# Patient Record
Sex: Female | Born: 1945
Health system: Southern US, Community
[De-identification: ages and names within clinical notes are randomized; demographics above are authoritative.]

## PROBLEM LIST (undated history)

## (undated) DIAGNOSIS — I10 Essential (primary) hypertension: Secondary | ICD-10-CM

## (undated) DIAGNOSIS — I480 Paroxysmal atrial fibrillation: Secondary | ICD-10-CM

## (undated) DIAGNOSIS — R002 Palpitations: Secondary | ICD-10-CM

## (undated) HISTORY — DX: Palpitations: R00.2

## (undated) HISTORY — DX: Paroxysmal atrial fibrillation: I48.0

## (undated) HISTORY — DX: Essential (primary) hypertension: I10

---

## 1998-01-01 ENCOUNTER — Other Ambulatory Visit: Admission: RE | Admit: 1998-01-01 | Discharge: 1998-01-01 | Payer: Self-pay | Admitting: Obstetrics & Gynecology

## 1998-01-23 ENCOUNTER — Ambulatory Visit (HOSPITAL_COMMUNITY): Admission: RE | Admit: 1998-01-23 | Discharge: 1998-01-23 | Payer: Self-pay | Admitting: Obstetrics & Gynecology

## 1998-03-19 ENCOUNTER — Emergency Department (HOSPITAL_COMMUNITY): Admission: EM | Admit: 1998-03-19 | Discharge: 1998-03-19 | Payer: Self-pay | Admitting: Emergency Medicine

## 1999-03-15 ENCOUNTER — Ambulatory Visit (HOSPITAL_COMMUNITY): Admission: RE | Admit: 1999-03-15 | Discharge: 1999-03-15 | Payer: Self-pay | Admitting: Obstetrics & Gynecology

## 1999-03-15 ENCOUNTER — Encounter: Payer: Self-pay | Admitting: Obstetrics & Gynecology

## 1999-05-21 ENCOUNTER — Other Ambulatory Visit: Admission: RE | Admit: 1999-05-21 | Discharge: 1999-05-21 | Payer: Self-pay | Admitting: Obstetrics & Gynecology

## 1999-12-14 ENCOUNTER — Encounter: Payer: Self-pay | Admitting: Obstetrics & Gynecology

## 1999-12-14 ENCOUNTER — Encounter: Admission: RE | Admit: 1999-12-14 | Discharge: 1999-12-14 | Payer: Self-pay | Admitting: Obstetrics & Gynecology

## 2000-04-28 ENCOUNTER — Ambulatory Visit (HOSPITAL_COMMUNITY): Admission: RE | Admit: 2000-04-28 | Discharge: 2000-04-28 | Payer: Self-pay | Admitting: Gastroenterology

## 2000-09-26 ENCOUNTER — Other Ambulatory Visit: Admission: RE | Admit: 2000-09-26 | Discharge: 2000-09-26 | Payer: Self-pay | Admitting: Obstetrics & Gynecology

## 2001-04-20 ENCOUNTER — Encounter: Payer: Self-pay | Admitting: Obstetrics & Gynecology

## 2001-04-20 ENCOUNTER — Ambulatory Visit (HOSPITAL_COMMUNITY): Admission: RE | Admit: 2001-04-20 | Discharge: 2001-04-20 | Payer: Self-pay | Admitting: Obstetrics & Gynecology

## 2001-10-02 ENCOUNTER — Other Ambulatory Visit: Admission: RE | Admit: 2001-10-02 | Discharge: 2001-10-02 | Payer: Self-pay | Admitting: Obstetrics & Gynecology

## 2002-05-13 ENCOUNTER — Encounter: Payer: Self-pay | Admitting: Obstetrics & Gynecology

## 2002-05-13 ENCOUNTER — Ambulatory Visit (HOSPITAL_COMMUNITY): Admission: RE | Admit: 2002-05-13 | Discharge: 2002-05-13 | Payer: Self-pay | Admitting: Obstetrics & Gynecology

## 2003-05-21 ENCOUNTER — Other Ambulatory Visit: Admission: RE | Admit: 2003-05-21 | Discharge: 2003-05-21 | Payer: Self-pay | Admitting: Obstetrics & Gynecology

## 2004-06-22 ENCOUNTER — Ambulatory Visit (HOSPITAL_COMMUNITY): Admission: RE | Admit: 2004-06-22 | Discharge: 2004-06-22 | Payer: Self-pay | Admitting: Obstetrics & Gynecology

## 2004-11-16 ENCOUNTER — Other Ambulatory Visit: Admission: RE | Admit: 2004-11-16 | Discharge: 2004-11-16 | Payer: Self-pay | Admitting: Obstetrics & Gynecology

## 2005-09-27 ENCOUNTER — Ambulatory Visit (HOSPITAL_COMMUNITY): Admission: RE | Admit: 2005-09-27 | Discharge: 2005-09-27 | Payer: Self-pay | Admitting: Obstetrics & Gynecology

## 2005-11-24 ENCOUNTER — Other Ambulatory Visit: Admission: RE | Admit: 2005-11-24 | Discharge: 2005-11-24 | Payer: Self-pay | Admitting: Obstetrics & Gynecology

## 2006-11-16 ENCOUNTER — Ambulatory Visit (HOSPITAL_COMMUNITY): Admission: RE | Admit: 2006-11-16 | Discharge: 2006-11-16 | Payer: Self-pay | Admitting: Obstetrics & Gynecology

## 2008-01-16 ENCOUNTER — Ambulatory Visit (HOSPITAL_COMMUNITY): Admission: RE | Admit: 2008-01-16 | Discharge: 2008-01-16 | Payer: Self-pay | Admitting: Obstetrics & Gynecology

## 2009-02-02 ENCOUNTER — Ambulatory Visit (HOSPITAL_COMMUNITY): Admission: RE | Admit: 2009-02-02 | Discharge: 2009-02-02 | Payer: Self-pay | Admitting: Obstetrics & Gynecology

## 2010-04-06 ENCOUNTER — Ambulatory Visit (HOSPITAL_COMMUNITY): Admission: RE | Admit: 2010-04-06 | Discharge: 2010-04-06 | Payer: Self-pay | Admitting: Obstetrics & Gynecology

## 2011-01-07 NOTE — Procedures (Signed)
[facility]  Patient:    [name] [name], [name] [name]                       MRN: [id number] Proc. Date: 04/28/00 Adm. Date:  [id number] Attending:  Louie Bun CC:         Marinda Elk, M.D.   Procedure Report  PROCEDURE:  Colonoscopy.  INDICATION FOR PROCEDURE:  Family history of colon cancer in a first degree relative in a 65 year old patient with no prior colon screening.  DESCRIPTION OF PROCEDURE:  The patient was placed in the left lateral decubitus position and placed on the pulse monitor with continuous low flow oxygen delivered by nasal cannula. She was sedated with 50 mg IV Demerol and 7 mg IV Versed. The Olympus video colonoscope was inserted into the rectum and advanced to the cecum, confirmed by transillumination of McBurneys point and visualization of the ileocecal valve and appendiceal orifice. The prep was excellent. The cecum, ascending, transverse, descending and sigmoid colon all appeared normal with no masses, polyps, diverticula or other mucosal abnormalities. The rectum likewise appeared normal and retroflexed view of the anus revealed some small internal hemorrhoids. The colonoscope was then withdrawn and the patient returned to the recovery room in stable condition. The patient tolerated the procedure well and there were no immediate complications.  IMPRESSION:  Internal hemorrhoids otherwise normal colonoscopy.  PLAN:  Repeat colonoscopy in 5 years. DD:  04/28/00 TD:  04/29/00 Job: [id number]

## 2011-05-11 ENCOUNTER — Other Ambulatory Visit (HOSPITAL_COMMUNITY): Payer: Self-pay | Admitting: Obstetrics & Gynecology

## 2011-05-11 DIAGNOSIS — Z1231 Encounter for screening mammogram for malignant neoplasm of breast: Secondary | ICD-10-CM

## 2011-05-18 ENCOUNTER — Ambulatory Visit (HOSPITAL_COMMUNITY)
Admission: RE | Admit: 2011-05-18 | Discharge: 2011-05-18 | Disposition: A | Discharge status: Home / Self Care | Payer: Medicare HMO | Source: Ambulatory Visit | Attending: Obstetrics & Gynecology | Admitting: Obstetrics & Gynecology

## 2011-05-18 DIAGNOSIS — Z1231 Encounter for screening mammogram for malignant neoplasm of breast: Secondary | ICD-10-CM | POA: Insufficient documentation

## 2011-05-19 ENCOUNTER — Ambulatory Visit (HOSPITAL_COMMUNITY): Payer: Self-pay

## 2012-05-15 ENCOUNTER — Other Ambulatory Visit (HOSPITAL_COMMUNITY): Payer: Self-pay | Admitting: Obstetrics & Gynecology

## 2012-05-15 DIAGNOSIS — Z1231 Encounter for screening mammogram for malignant neoplasm of breast: Secondary | ICD-10-CM

## 2012-05-29 ENCOUNTER — Ambulatory Visit (HOSPITAL_COMMUNITY)
Admission: RE | Admit: 2012-05-29 | Discharge: 2012-05-29 | Disposition: A | Discharge status: Home / Self Care | Payer: Medicare Other | Source: Ambulatory Visit | Attending: Obstetrics & Gynecology | Admitting: Obstetrics & Gynecology

## 2012-05-29 DIAGNOSIS — Z1231 Encounter for screening mammogram for malignant neoplasm of breast: Secondary | ICD-10-CM | POA: Insufficient documentation

## 2013-06-03 ENCOUNTER — Other Ambulatory Visit (HOSPITAL_COMMUNITY): Payer: Self-pay | Admitting: Obstetrics & Gynecology

## 2013-06-03 DIAGNOSIS — Z1231 Encounter for screening mammogram for malignant neoplasm of breast: Secondary | ICD-10-CM

## 2013-06-18 ENCOUNTER — Ambulatory Visit (HOSPITAL_COMMUNITY)
Admission: RE | Admit: 2013-06-18 | Discharge: 2013-06-18 | Disposition: A | Discharge status: Home / Self Care | Payer: Medicare Other | Source: Ambulatory Visit | Attending: Obstetrics & Gynecology | Admitting: Obstetrics & Gynecology

## 2013-06-18 DIAGNOSIS — Z1231 Encounter for screening mammogram for malignant neoplasm of breast: Secondary | ICD-10-CM

## 2014-01-07 ENCOUNTER — Encounter: Payer: Self-pay | Admitting: Interventional Cardiology

## 2014-01-07 ENCOUNTER — Ambulatory Visit (INDEPENDENT_AMBULATORY_CARE_PROVIDER_SITE_OTHER): Payer: Medicare Other | Admitting: Interventional Cardiology

## 2014-01-07 VITALS — BP 124/86 | HR 66 | Ht 67.0 in | Wt 193.0 lb

## 2014-01-07 DIAGNOSIS — I4891 Unspecified atrial fibrillation: Secondary | ICD-10-CM

## 2014-01-07 DIAGNOSIS — R002 Palpitations: Secondary | ICD-10-CM | POA: Insufficient documentation

## 2014-01-07 DIAGNOSIS — I10 Essential (primary) hypertension: Secondary | ICD-10-CM | POA: Insufficient documentation

## 2014-01-07 DIAGNOSIS — I480 Paroxysmal atrial fibrillation: Secondary | ICD-10-CM | POA: Insufficient documentation

## 2014-01-07 DIAGNOSIS — Z8679 Personal history of other diseases of the circulatory system: Secondary | ICD-10-CM

## 2014-01-07 NOTE — Patient Instructions (Signed)
Your physician recommends that you continue on your current medications as directed. Please refer to the Current Medication list given to you today.  Your physician discussed the importance of regular exercise and recommended that you start or continue a regular exercise program for good health.   Your physician wants you to follow-up in: 1 year You will receive a reminder letter in the mail two months in advance. If you don't receive a letter, please call our office to schedule the follow-up appointment.  

## 2014-01-07 NOTE — Progress Notes (Signed)
 Patient ID: [name] [name], female   DOB: 07/27/1946, 68 y.o.   MRN: [id number]    [address] [place], [place]  [address] Phone: [phone] Fax:  [phone]  Date:  01/07/2014   ID:  [name] [name], DOB 11/14/1945, MRN [id number]  PCP:  No primary provider on file.   ASSESSMENT:  1. Paroxysmal atrial fibrillation 2. Hypertension 3. Structurally normal heart. He though there is a "history of mitral valve prolapse" an echocardiogram done in the past demonstrated straight a structurally normal heart  PLAN:  1. low-salt diet 2. Aerobic activity 3. She is to call if any prolonged episodes of palpitation or fatigue   SUBJECTIVE: [name] [name] is a 68 y.o. female who is asymptomatic. Denies neurological complaints.   Wt Readings from Last 3 Encounters:  01/07/14 193 lb (87.544 kg)     No past medical history on file.  Current Outpatient Prescriptions  Medication Sig Dispense Refill  . aspirin 81 MG tablet Take 81 mg by mouth daily.      Marland Kitchen. losartan-hydrochlorothiazide (HYZAAR) 50-12.5 MG per tablet Take 1 tablet by mouth daily.      . metoprolol succinate (TOPROL-XL) 100 MG 24 hr tablet Take 1 tablet by mouth daily.      . Multiple Vitamins-Minerals (EYE VITAMINS PO) Take 1 tablet by mouth daily.       No current facility-administered medications for this visit.    Allergies:   Allergies not on file  Social History:  The patient  reports that she has never smoked. She does not have any smokeless tobacco history on file. She reports that she does not drink alcohol or use illicit drugs.   ROS:  Please see the history of present illness.      All other systems reviewed and negative.   OBJECTIVE: VS:  BP 124/86  Pulse 66  Ht 5\' 7"  (1.702 m)  Wt 193 lb (87.544 kg)  BMI 30.22 kg/m2 Well nourished, well developed, in no acute distress, younger than stated age HEENT: normal Neck: JVD flat. Carotid bruit absent  Cardiac:  normal S1, S2; RRR; no murmur Lungs:   clear to auscultation bilaterally, no wheezing, rhonchi or rales Abd: soft, nontender, no hepatomegaly Ext: Edema absent. Pulses 2+ Skin: warm and dry Neuro:  CNs 2-12 intact, no focal abnormalities noted  EKG:  Normal sinus rhythm with RSR prime in V1. Unchanged from prior       Signed, Darci NeedleHenry W. B.  III, MD 01/07/2014 9:59 AM

## 2014-01-29 ENCOUNTER — Other Ambulatory Visit: Payer: Self-pay

## 2014-01-29 MED ORDER — LOSARTAN POTASSIUM-HCTZ 50-12.5 MG PO TABS: 1.0000 | ORAL_TABLET | Freq: Every day | ORAL | Status: DC

## 2014-03-04 ENCOUNTER — Other Ambulatory Visit: Payer: Self-pay | Admitting: Interventional Cardiology

## 2014-05-19 ENCOUNTER — Other Ambulatory Visit (HOSPITAL_COMMUNITY): Payer: Self-pay | Admitting: Obstetrics & Gynecology

## 2014-05-19 DIAGNOSIS — Z1231 Encounter for screening mammogram for malignant neoplasm of breast: Secondary | ICD-10-CM

## 2014-06-24 ENCOUNTER — Ambulatory Visit (HOSPITAL_COMMUNITY)
Admission: RE | Admit: 2014-06-24 | Discharge: 2014-06-24 | Disposition: A | Discharge status: Home / Self Care | Payer: Medicare Other | Source: Ambulatory Visit | Attending: Obstetrics & Gynecology | Admitting: Obstetrics & Gynecology

## 2014-06-24 DIAGNOSIS — Z1231 Encounter for screening mammogram for malignant neoplasm of breast: Secondary | ICD-10-CM | POA: Insufficient documentation

## 2014-09-23 ENCOUNTER — Other Ambulatory Visit: Payer: Self-pay | Admitting: *Deleted

## 2014-09-23 MED ORDER — LOSARTAN POTASSIUM-HCTZ 50-12.5 MG PO TABS: 1.0000 | ORAL_TABLET | Freq: Every day | ORAL | Status: DC

## 2014-12-31 ENCOUNTER — Other Ambulatory Visit: Payer: Self-pay | Admitting: Interventional Cardiology

## 2015-01-08 ENCOUNTER — Ambulatory Visit: Payer: Medicare Other | Admitting: Interventional Cardiology

## 2015-01-09 ENCOUNTER — Encounter: Payer: Self-pay | Admitting: *Deleted

## 2015-01-13 ENCOUNTER — Ambulatory Visit (INDEPENDENT_AMBULATORY_CARE_PROVIDER_SITE_OTHER): Payer: PPO | Admitting: Interventional Cardiology

## 2015-01-13 ENCOUNTER — Encounter: Payer: Self-pay | Admitting: Interventional Cardiology

## 2015-01-13 VITALS — BP 146/84 | HR 77 | Ht 65.0 in | Wt 199.0 lb

## 2015-01-13 DIAGNOSIS — I480 Paroxysmal atrial fibrillation: Secondary | ICD-10-CM

## 2015-01-13 DIAGNOSIS — I4891 Unspecified atrial fibrillation: Secondary | ICD-10-CM

## 2015-01-13 DIAGNOSIS — I10 Essential (primary) hypertension: Secondary | ICD-10-CM | POA: Diagnosis not present

## 2015-01-13 DIAGNOSIS — R002 Palpitations: Secondary | ICD-10-CM | POA: Diagnosis not present

## 2015-01-13 NOTE — Progress Notes (Signed)
Cardiology Office Note   Date:  01/13/2015   ID:  [name], [name] 1946-03-12, MRN [id number]  PCP:  Lenora Boys, MD  Cardiologist:  Lesleigh Noe, MD   Chief Complaint  Patient presents with  . PAROXYSMAL ATRIAL FIBRILLATION      History of Present Illness: [name] [name] is a 69 y.o. female who presents for palpitations, remote history of A. Fib, and hypertension.  Dede is doing well. She has no cardiopulmonary complaints. No medication side effects.    Past Medical History  Diagnosis Date  . Palpitation   . Paroxysmal atrial fibrillation   . Essential (primary) hypertension     No past surgical history on file.   Current Outpatient Prescriptions  Medication Sig Dispense Refill  . aspirin 81 MG tablet Take 81 mg by mouth daily.    Marland Kitchen losartan-hydrochlorothiazide (HYZAAR) 50-12.5 MG per tablet Take 1 tablet by mouth daily. 30 tablet 3  . metoprolol succinate (TOPROL-XL) 100 MG 24 hr tablet TAKE 1 TABLET BY MOUTH EVERY DAY 30 tablet 0  . Multiple Vitamins-Minerals (EYE VITAMINS PO) Take 1 tablet by mouth daily.     No current facility-administered medications for this visit.    Allergies:   Review of patient's allergies indicates no known allergies.    Social History:  The patient  reports that she has never smoked. She has never used smokeless tobacco. She reports that she does not drink alcohol or use illicit drugs.   Family History:  The patient's family history includes Colon cancer in her father; Diabetes in her brother, brother, and sister; Healthy in her sister and sister; Heart attack in her mother; Heart disease in her brother and mother; Leukemia in her brother.    ROS:  Please see the history of present illness.   Otherwise, review of systems are positive for none.   All other systems are reviewed and negative.    PHYSICAL EXAM: VS:  BP 146/84 mmHg  Pulse 77  Ht  (1.651 m)  Wt 199 lb (90.266 kg)  BMI 33.12 kg/m2 , BMI Body mass  index is 33.12 kg/(m^2). GEN: Well nourished, well developed, in no acute distress HEENT: normal Neck: no JVD, carotid bruits, or masses Cardiac: RRR; no murmurs, rubs, or gallops,no edema  Respiratory:  clear to auscultation bilaterally, normal work of breathing GI: soft, nontender, nondistended, + BS MS: no deformity or atrophy Skin: warm and dry, no rash Neuro:  Strength and sensation are intact Psych: euthymic mood, full affect   EKG:  EKG is ordered today. The ekg ordered today demonstrates normal sinus rhythm, left atrial abnormality, otherwise normal.   Recent Labs: No results found for requested labs within last 365 days.    Lipid Panel No results found for: CHOL, TRIG, HDL, CHOLHDL, VLDL, LDLCALC, LDLDIRECT    Wt Readings from Last 3 Encounters:  01/13/15 199 lb (90.266 kg)  01/07/14 193 lb (87.544 kg)      Other studies Reviewed: Additional studies/ records that were reviewed today include: . Review of the above records demonstrates:    ASSESSMENT AND PLAN:  Paroxysmal atrial fibrillation - CHADS VASC is 2. If ever has recurrent A. fib, we'll need to consider anticoagulation.  Essential hypertension - mildly elevated       Current medicines are reviewed at length with the patient today.  The patient does not have concerns regarding medicines.  The following changes have been made:  no change  Labs/ tests  ordered today include:  No orders of the defined types were placed in this encounter.     Disposition:   FU with HS in 1 year  Signed, Lesleigh NoeSMITH III,Enis Riecke W, MD  01/13/2015 9:51 AM    [facility] [address], [place], [place]  [address] Phone: [phone]([phone]; Fax: [phone]

## 2015-01-13 NOTE — Patient Instructions (Signed)
Medication Instructions:  Your physician recommends that you continue on your current medications as directed. Please refer to the Current Medication list given to you today.   Labwork: None   Testing/Procedures: None   Follow-Up: Your physician wants you to follow-up in: 1 year with Dr.Smith You will receive a reminder letter in the mail two months in advance. If you don't receive a letter, please call our office to schedule the follow-up appointment.   Any Other Special Instructions Will Be Listed Below (If Applicable). Your physician discussed the importance of regular exercise and recommended that you start or continue a regular exercise program for good health.  Low-Sodium Eating Plan Sodium raises blood pressure and causes water to be held in the body. Getting less sodium from food will help lower your blood pressure, reduce any swelling, and protect your heart, liver, and kidneys. We get sodium by adding salt (sodium chloride) to food. Most of our sodium comes from canned, boxed, and frozen foods. Restaurant foods, fast foods, and pizza are also very high in sodium. Even if you take medicine to lower your blood pressure or to reduce fluid in your body, getting less sodium from your food is important. WHAT IS MY PLAN? Most people should limit their sodium intake to 2,300 mg a day. Your health care provider recommends that you limit your sodium intake to __________ a day.  WHAT DO I NEED TO KNOW ABOUT THIS EATING PLAN? For the low-sodium eating plan, you will follow these general guidelines:  Choose foods with a % Daily Value for sodium of less than 5% (as listed on the food label).   Use salt-free seasonings or herbs instead of table salt or sea salt.   Check with your health care provider or pharmacist before using salt substitutes.   Eat fresh foods.  Eat more vegetables and fruits.  Limit canned vegetables. If you do use them, rinse them well to decrease the sodium.    Limit cheese to 1 oz (28 g) per day.   Eat lower-sodium products, often labeled as "lower sodium" or "no salt added."  Avoid foods that contain monosodium glutamate (MSG). MSG is sometimes added to Chinese food and some canned foods.  Check food labels (Nutrition Facts labels) on foods to learn how much sodium is in one serving.  Eat more home-cooked food and less restaurant, buffet, and fast food.  When eating at a restaurant, ask that your food be prepared with less salt or none, if possible.  HOW DO I READ FOOD LABELS FOR SODIUM INFORMATION? The Nutrition Facts label lists the amount of sodium in one serving of the food. If you eat more than one serving, you must multiply the listed amount of sodium by the number of servings. Food labels may also identify foods as:  Sodium free--Less than 5 mg in a serving.  Very low sodium--35 mg or less in a serving.  Low sodium--140 mg or less in a serving.  Light in sodium--50% less sodium in a serving. For example, if a food that usually has 300 mg of sodium is changed to become light in sodium, it will have 150 mg of sodium.  Reduced sodium--25% less sodium in a serving. For example, if a food that usually has 400 mg of sodium is changed to reduced sodium, it will have 300 mg of sodium. WHAT FOODS CAN I EAT? Grains Low-sodium cereals, including oats, puffed wheat and rice, and shredded wheat cereals. Low-sodium crackers. Unsalted rice and pasta. Lower-sodium   bread.  Vegetables Frozen or fresh vegetables. Low-sodium or reduced-sodium canned vegetables. Low-sodium or reduced-sodium tomato sauce and paste. Low-sodium or reduced-sodium tomato and vegetable juices.  Fruits Fresh, frozen, and canned fruit. Fruit juice.  Meat and Other Protein Products Low-sodium canned tuna and salmon. Fresh or frozen meat, poultry, seafood, and fish. Lamb. Unsalted nuts. Dried beans, peas, and lentils without added salt. Unsalted canned beans.  Homemade soups without salt. Eggs.  Dairy Milk. Soy milk. Ricotta cheese. Low-sodium or reduced-sodium cheeses. Yogurt.  Condiments Fresh and dried herbs and spices. Salt-free seasonings. Onion and garlic powders. Low-sodium varieties of mustard and ketchup. Lemon juice.  Fats and Oils Reduced-sodium salad dressings. Unsalted butter.  Other Unsalted popcorn and pretzels.  The items listed above may not be a complete list of recommended foods or beverages. Contact your dietitian for more options. WHAT FOODS ARE NOT RECOMMENDED? Grains Instant hot cereals. Bread stuffing, pancake, and biscuit mixes. Croutons. Seasoned rice or pasta mixes. Noodle soup cups. Boxed or frozen macaroni and cheese. Self-rising flour. Regular salted crackers. Vegetables Regular canned vegetables. Regular canned tomato sauce and paste. Regular tomato and vegetable juices. Frozen vegetables in sauces. Salted french fries. Olives. Pickles. Relishes. Sauerkraut. Salsa. Meat and Other Protein Products Salted, canned, smoked, spiced, or pickled meats, seafood, or fish. Bacon, ham, sausage, hot dogs, corned beef, chipped beef, and packaged luncheon meats. Salt pork. Jerky. Pickled herring. Anchovies, regular canned tuna, and sardines. Salted nuts. Dairy Processed cheese and cheese spreads. Cheese curds. Blue cheese and cottage cheese. Buttermilk.  Condiments Onion and garlic salt, seasoned salt, table salt, and sea salt. Canned and packaged gravies. Worcestershire sauce. Tartar sauce. Barbecue sauce. Teriyaki sauce. Soy sauce, including reduced sodium. Steak sauce. Fish sauce. Oyster sauce. Cocktail sauce. Horseradish. Regular ketchup and mustard. Meat flavorings and tenderizers. Bouillon cubes. Hot sauce. Tabasco sauce. Marinades. Taco seasonings. Relishes. Fats and Oils Regular salad dressings. Salted butter. Margarine. Ghee. Bacon fat.  Other Potato and tortilla chips. Corn chips and puffs. Salted popcorn  and pretzels. Canned or dried soups. Pizza. Frozen entrees and pot pies.  The items listed above may not be a complete list of foods and beverages to avoid. Contact your dietitian for more information. Document Released: 01/28/2002 Document Revised: 08/13/2013 Document Reviewed: 06/12/2013 Exit[employer] Patient Information 2015 [employer]. This information is not intended to replace advice given to you by your health care provider. Make sure you discuss any questions you have with your health care provider.  

## 2015-01-28 ENCOUNTER — Other Ambulatory Visit: Payer: Self-pay | Admitting: Interventional Cardiology

## 2015-01-28 ENCOUNTER — Other Ambulatory Visit: Payer: Self-pay | Admitting: *Deleted

## 2015-01-28 MED ORDER — LOSARTAN POTASSIUM-HCTZ 50-12.5 MG PO TABS: 1.0000 | ORAL_TABLET | Freq: Every day | ORAL | Status: DC

## 2015-05-21 ENCOUNTER — Other Ambulatory Visit: Payer: Self-pay | Admitting: *Deleted

## 2015-05-21 MED ORDER — LOSARTAN POTASSIUM-HCTZ 50-12.5 MG PO TABS: 1.0000 | ORAL_TABLET | Freq: Every day | ORAL | Status: DC

## 2015-05-28 ENCOUNTER — Other Ambulatory Visit: Payer: Self-pay

## 2015-05-28 DIAGNOSIS — Z1231 Encounter for screening mammogram for malignant neoplasm of breast: Secondary | ICD-10-CM

## 2015-06-30 ENCOUNTER — Ambulatory Visit
Admission: RE | Admit: 2015-06-30 | Discharge: 2015-06-30 | Disposition: A | Discharge status: Home / Self Care | Payer: PPO | Source: Ambulatory Visit

## 2015-06-30 DIAGNOSIS — Z1231 Encounter for screening mammogram for malignant neoplasm of breast: Secondary | ICD-10-CM

## 2015-12-20 ENCOUNTER — Other Ambulatory Visit: Payer: Self-pay | Admitting: Interventional Cardiology

## 2016-01-18 ENCOUNTER — Other Ambulatory Visit: Payer: Self-pay | Admitting: Interventional Cardiology

## 2016-01-21 ENCOUNTER — Other Ambulatory Visit: Payer: Self-pay | Admitting: Interventional Cardiology

## 2016-02-16 ENCOUNTER — Other Ambulatory Visit: Payer: Self-pay | Admitting: Interventional Cardiology

## 2016-03-22 ENCOUNTER — Telehealth: Payer: Self-pay

## 2016-03-22 ENCOUNTER — Other Ambulatory Visit: Payer: Self-pay | Admitting: Interventional Cardiology

## 2016-03-22 NOTE — Telephone Encounter (Signed)
See previous note

## 2016-03-22 NOTE — Telephone Encounter (Signed)
called and spoke with patient to let her know she needs to schedule follow up office visit to recieve further refills. She expressed her understanding, transfered to scheduling and explained to ask for refills after scheduling appointment.

## 2016-06-06 ENCOUNTER — Ambulatory Visit (INDEPENDENT_AMBULATORY_CARE_PROVIDER_SITE_OTHER): Payer: PPO | Admitting: Interventional Cardiology

## 2016-06-06 ENCOUNTER — Encounter (INDEPENDENT_AMBULATORY_CARE_PROVIDER_SITE_OTHER): Payer: Self-pay

## 2016-06-06 ENCOUNTER — Encounter: Payer: Self-pay | Admitting: Interventional Cardiology

## 2016-06-06 VITALS — BP 140/88 | HR 68 | Ht 65.0 in | Wt 194.0 lb

## 2016-06-06 DIAGNOSIS — I10 Essential (primary) hypertension: Secondary | ICD-10-CM | POA: Diagnosis not present

## 2016-06-06 DIAGNOSIS — I480 Paroxysmal atrial fibrillation: Secondary | ICD-10-CM

## 2016-06-06 MED ORDER — LOSARTAN POTASSIUM-HCTZ 50-12.5 MG PO TABS: 1.0000 | ORAL_TABLET | Freq: Every day | ORAL | 11 refills | Status: DC

## 2016-06-06 MED ORDER — METOPROLOL SUCCINATE ER 100 MG PO TB24: 100.0000 mg | ORAL_TABLET | Freq: Every day | ORAL | 11 refills | Status: DC

## 2016-06-06 NOTE — Patient Instructions (Signed)

## 2016-06-06 NOTE — Progress Notes (Signed)
Cardiology Office Note    Date:  06/06/2016   ID:  [name] [name], DOB 02/22/1946, MRN [id number]  PCP:  Joycelyn RuaMEYERS, STEPHEN, MD  Cardiologist: Lesleigh NoeHenry W Teyla Skidgel III, MD   Chief Complaint  Patient presents with  . Follow-up    History of Present Illness:  [name] [name] is a 70 y.o. female follow-up of palpitations with some concern for possible presence of atrial fibrillation although no recent complaints. Essential hypertension is also a known clinical problem.  She is doing well. She denies palpitations or prolonged heart racing. No medication side effects.  Past Medical History:  Diagnosis Date  . Essential (primary) hypertension   . Palpitation   . Paroxysmal atrial fibrillation (HCC)     No past surgical history on file.  Current Medications: Outpatient Medications Prior to Visit  Medication Sig Dispense Refill  . aspirin 81 MG tablet Take 81 mg by mouth daily.    . metoprolol succinate (TOPROL-XL) 100 MG 24 hr tablet TAKE 1 TABLET BY MOUTH EVERY DAY 30 tablet 1  . Multiple Vitamins-Minerals (EYE VITAMINS PO) Take 1 tablet by mouth daily.    Marland Kitchen. losartan-hydrochlorothiazide (HYZAAR) 50-12.5 MG tablet TAKE 1 TABLET BY MOUTH DAILY. PLEASE CALL AND SCHEDULE A ONE YEAR FOLLOW UP APPOINTMENT (Patient not taking: Reported on 06/06/2016) 15 tablet 0   No facility-administered medications prior to visit.      Allergies:   Review of patient's allergies indicates no known allergies.   Social History   Social History  . Marital status: Married    Spouse name: N/A  . Number of children: N/A  . Years of education: N/A   Social History Main Topics  . Smoking status: Never Smoker  . Smokeless tobacco: Never Used  . Alcohol use No  . Drug use: No  . Sexual activity: Not Asked   Other Topics Concern  . None   Social History Narrative  . None     Family History:  The patient's family history includes Colon cancer in her father; Diabetes in her brother, brother, and sister;  Healthy in her sister and sister; Heart attack in her mother; Heart disease in her brother and mother; Leukemia in her brother.   ROS:   Please see the history of present illness.    Concerned about weight gain but otherwise no complaints  All other systems reviewed and are negative.   PHYSICAL EXAM:   VS:  BP 140/88   Pulse 68   Ht 5\' 5"  (1.651 m)   Wt 194 lb (88 kg)   BMI 32.28 kg/m    GEN: Well nourished, well developed, in no acute distress  HEENT: normal  Neck: no JVD, carotid bruits, or masses Cardiac: RRR; no murmurs, rubs, or gallops,no edema  Respiratory:  clear to auscultation bilaterally, normal work of breathing GI: soft, nontender, nondistended, + BS MS: no deformity or atrophy  Skin: warm and dry, no rash Neuro:  Alert and Oriented x 3, Strength and sensation are intact Psych: euthymic mood, full affect  Wt Readings from Last 3 Encounters:  06/06/16 194 lb (88 kg)  01/13/15 199 lb (90.3 kg)  01/07/14 193 lb (87.5 kg)      Studies/Labs Reviewed:   EKG:  EKG  Normal sinus rhythm, left atrial abnormality, nonspecific ST abnormality.  Recent Labs: No results found for requested labs within last 8760 hours.   Lipid Panel No results found for: CHOL, TRIG, HDL, CHOLHDL, VLDL, LDLCALC, LDLDIRECT  Additional studies/ records  that were reviewed today include:  No new data    ASSESSMENT:    1. Essential hypertension   2. Paroxysmal atrial fibrillation (HCC)      PLAN:  In order of problems listed above:  1. Refill metoprolol and Hyzaar. Aerobic activity. 2 g sodium diet. 2. Call of tachycardia or prolonged palpitations.    Medication Adjustments/Labs and Tests Ordered: Current medicines are reviewed at length with the patient today.  Concerns regarding medicines are outlined above.  Medication changes, Labs and Tests ordered today are listed in the Patient Instructions below. There are no Patient Instructions on file for this visit.    Signed, Lesleigh Noe, MD  06/06/2016 12:33 PM    [facility] [address] [place], [place], [place]  [address] Phone: [phone]; Fax: [phone]

## 2016-06-21 ENCOUNTER — Other Ambulatory Visit: Payer: Self-pay | Admitting: Obstetrics & Gynecology

## 2016-06-21 DIAGNOSIS — Z1231 Encounter for screening mammogram for malignant neoplasm of breast: Secondary | ICD-10-CM

## 2016-07-05 ENCOUNTER — Ambulatory Visit
Admission: RE | Admit: 2016-07-05 | Discharge: 2016-07-05 | Disposition: A | Discharge status: Home / Self Care | Payer: PPO | Source: Ambulatory Visit | Attending: Obstetrics & Gynecology | Admitting: Obstetrics & Gynecology

## 2016-07-05 DIAGNOSIS — Z1231 Encounter for screening mammogram for malignant neoplasm of breast: Secondary | ICD-10-CM

## 2016-08-02 ENCOUNTER — Other Ambulatory Visit: Payer: Self-pay | Admitting: Interventional Cardiology

## 2016-08-23 ENCOUNTER — Other Ambulatory Visit: Payer: Self-pay

## 2016-08-23 DIAGNOSIS — Z01419 Encounter for gynecological examination (general) (routine) without abnormal findings: Secondary | ICD-10-CM | POA: Diagnosis not present

## 2016-08-23 DIAGNOSIS — Z6832 Body mass index (BMI) 32.0-32.9, adult: Secondary | ICD-10-CM | POA: Diagnosis not present

## 2016-08-23 MED ORDER — METOPROLOL SUCCINATE ER 100 MG PO TB24: 100.0000 mg | ORAL_TABLET | Freq: Every day | ORAL | 11 refills | Status: DC

## 2016-08-23 MED ORDER — LOSARTAN POTASSIUM-HCTZ 50-12.5 MG PO TABS: 1.0000 | ORAL_TABLET | Freq: Every day | ORAL | 3 refills | Status: DC

## 2017-06-13 ENCOUNTER — Encounter: Payer: Self-pay | Admitting: Interventional Cardiology

## 2017-06-13 ENCOUNTER — Ambulatory Visit (INDEPENDENT_AMBULATORY_CARE_PROVIDER_SITE_OTHER): Payer: Medicare HMO | Admitting: Interventional Cardiology

## 2017-06-13 VITALS — BP 156/94 | HR 85 | Ht 65.0 in | Wt 196.8 lb

## 2017-06-13 DIAGNOSIS — I480 Paroxysmal atrial fibrillation: Secondary | ICD-10-CM | POA: Diagnosis not present

## 2017-06-13 DIAGNOSIS — I10 Essential (primary) hypertension: Secondary | ICD-10-CM

## 2017-06-13 DIAGNOSIS — R002 Palpitations: Secondary | ICD-10-CM | POA: Diagnosis not present

## 2017-06-13 MED ORDER — LOSARTAN POTASSIUM-HCTZ 100-12.5 MG PO TABS: 1.0000 | ORAL_TABLET | Freq: Every day | ORAL | 3 refills | Status: DC

## 2017-06-13 NOTE — Patient Instructions (Signed)
Medication Instructions:  1) INCREASE Hyzaar to 100/12.5mg  once daily  Labwork: You will need to have labs drawn 2-3 weeks after starting the increased dose.  Please contact our office once you start this, so we can make a lab appointment.   Testing/Procedures: None  Follow-Up: Your physician wants you to follow-up in: 1 year with Dr. Katrinka BlazingSmith.  You will receive a reminder letter in the mail two months in advance. If you don't receive a letter, please call our office to schedule the follow-up appointment.   Any Other Special Instructions Will Be Listed Below (If Applicable).     If you need a refill on your cardiac medications before your next appointment, please call your pharmacy.

## 2017-06-13 NOTE — Progress Notes (Signed)
Cardiology Office Note    Date:  06/13/2017   ID:  [name] [name], DOB 02/08/1946, MRN [id number]  PCP:  [name] RuaMeyers, Stephen, MD  Cardiologist: [name] NoeHenry W Lovel Suazo III, MD   Chief Complaint  Patient presents with  . Follow-up    hypertension    History of Present Illness:  [name] [name] is a 71 y.o. female with history of hypertension and paroxysmal atrial fibrillation (remote)  Not exercising. Denies cardiac symptoms. No orthopnea, PND, ankle swelling, chest pain, or palpitations. Overall she feels well.   Past Medical History:  Diagnosis Date  . Essential (primary) hypertension   . Palpitation   . Paroxysmal atrial fibrillation (HCC)     History reviewed. No pertinent surgical history.  Current Medications: Outpatient Medications Prior to Visit  Medication Sig Dispense Refill  . aspirin 81 MG tablet Take 81 mg by mouth daily.    . metoprolol succinate (TOPROL-XL) 100 MG 24 hr tablet Take 1 tablet (100 mg total) by mouth daily. Take with or immediately following a meal. 30 tablet 11  . Multiple Vitamins-Minerals (EYE VITAMINS PO) Take 1 tablet by mouth daily.    Marland Kitchen. losartan-hydrochlorothiazide (HYZAAR) 50-12.5 MG tablet Take 1 tablet by mouth daily. 90 tablet 3   No facility-administered medications prior to visit.      Allergies:   Patient has no known allergies.   Social History   Social History  . Marital status: Married    Spouse name: N/A  . Number of children: N/A  . Years of education: N/A   Social History Main Topics  . Smoking status: Never Smoker  . Smokeless tobacco: Never Used  . Alcohol use No  . Drug use: No  . Sexual activity: Not Asked   Other Topics Concern  . None   Social History Narrative  . None     Family History:  The patient's family history includes Colon cancer in her father; Diabetes in her brother, brother, and sister; Healthy in her sister and sister; Heart attack in her mother; Heart disease in her brother and mother; Leukemia in  her brother.   ROS:   Please see the history of present illness.    Some rash. Otherwise no complaints.  All other systems reviewed and are negative.   PHYSICAL EXAM:   VS:  BP (!) 156/94   Pulse 85   Ht 5\' 5"  (1.651 m)   Wt 196 lb 12.8 oz (89.3 kg)   BMI 32.75 kg/m    GEN: Well nourished, well developed, in no acute distress  HEENT: normal  Neck: no JVD, carotid bruits, or masses Cardiac: RRR; no murmurs, rubs, or gallops,no edema  Respiratory:  clear to auscultation bilaterally, normal work of breathing GI: soft, nontender, nondistended, + BS MS: no deformity or atrophy  Skin: warm and dry, no rash Neuro:  Alert and Oriented x 3, Strength and sensation are intact Psych: euthymic mood, full affect  Wt Readings from Last 3 Encounters:  06/13/17 196 lb 12.8 oz (89.3 kg)  06/06/16 194 lb (88 kg)  01/13/15 199 lb (90.3 kg)      Studies/Labs Reviewed:   EKG:  EKG  Normal sinus rhythm with nonspecific ST abnormality, left atrial abnormality.  Recent Labs: No results found for requested labs within last 8760 hours.   Lipid Panel No results found for: CHOL, TRIG, HDL, CHOLHDL, VLDL, LDLCALC, LDLDIRECT  Additional studies/ records that were reviewed today include:  None    ASSESSMENT:  1. Essential hypertension   2. Paroxysmal atrial fibrillation (HCC)   3. Palpitation      PLAN:  In order of problems listed above:  1. Ideal blood pressure for [name] [name] is 130/85 mmHg a less. Increase losartan HCT to 100/12.5 mg. 7-10 days after the dose titration, a basic metabolic panel should be done. 2. No symptoms to suggest recurrence. Continue aspirin once per day. 3. No complaints.  Low-salt diet, exercise, artery blood pressure 130/85 mmHg or less. Total and LDL cholesterol 170 and 100 or less. Clinical follow-up in one year.   Significant time during this office visit was discussing appropriate risk factor modification metrics for her particular clinical  situation. Greater than half of the time during this visit was spent in counseling concerning secondary prevention.  Medication Adjustments/Labs and Tests Ordered: Current medicines are reviewed at length with the patient today.  Concerns regarding medicines are outlined above.  Medication changes, Labs and Tests ordered today are listed in the Patient Instructions below. Patient Instructions  Medication Instructions:  1) INCREASE Hyzaar to 100/12.5mg  once daily  Labwork: You will need to have labs drawn 2-3 weeks after starting the increased dose.  Please contact our office once you start this, so we can make a lab appointment.   Testing/Procedures: None  Follow-Up: Your physician wants you to follow-up in: 1 year with Dr. Katrinka Blazing.  You will receive a reminder letter in the mail two months in advance. If you don't receive a letter, please call our office to schedule the follow-up appointment.   Any Other Special Instructions Will Be Listed Below (If Applicable).     If you need a refill on your cardiac medications before your next appointment, please call your pharmacy.      Signed, [name] Noe, MD  06/13/2017 11:22 AM    [facility] [address] [place], [place], [place]  [address] Phone: [phone]; Fax: [phone]

## 2017-06-16 DIAGNOSIS — R21 Rash and other nonspecific skin eruption: Secondary | ICD-10-CM | POA: Diagnosis not present

## 2017-06-16 DIAGNOSIS — L239 Allergic contact dermatitis, unspecified cause: Secondary | ICD-10-CM | POA: Diagnosis not present

## 2017-06-28 DIAGNOSIS — J0191 Acute recurrent sinusitis, unspecified: Secondary | ICD-10-CM | POA: Diagnosis not present

## 2017-07-26 DIAGNOSIS — Z0000 Encounter for general adult medical examination without abnormal findings: Secondary | ICD-10-CM | POA: Diagnosis not present

## 2017-07-26 DIAGNOSIS — I480 Paroxysmal atrial fibrillation: Secondary | ICD-10-CM | POA: Diagnosis not present

## 2017-07-26 DIAGNOSIS — I10 Essential (primary) hypertension: Secondary | ICD-10-CM | POA: Diagnosis not present

## 2017-07-26 DIAGNOSIS — E669 Obesity, unspecified: Secondary | ICD-10-CM | POA: Diagnosis not present

## 2017-08-10 ENCOUNTER — Other Ambulatory Visit: Payer: Self-pay | Admitting: Obstetrics & Gynecology

## 2017-08-10 DIAGNOSIS — Z1231 Encounter for screening mammogram for malignant neoplasm of breast: Secondary | ICD-10-CM

## 2017-08-24 ENCOUNTER — Telehealth: Payer: Self-pay | Admitting: Interventional Cardiology

## 2017-08-24 MED ORDER — LOSARTAN POTASSIUM-HCTZ 50-12.5 MG PO TABS: 1.0000 | ORAL_TABLET | Freq: Every day | ORAL | 3 refills | Status: DC

## 2017-08-24 NOTE — Telephone Encounter (Signed)
Had reminder in Epic to contact pt about f/u labs after increasing Hyzaar to 100/12.5mg .  Pt was to do this at the beginning of the year.  Pt states she was recently seen at PCP office and BP was still "borderline".  Pt does not want to increase dose at this time.  Pt prefers to continue to monitor and states she will have Dr. Lenise ArenaMeyers, PCP, monitor her BP for now.  Pt will plan to increase dose if BP doesn't improve.  Advised I would send information to Dr. Katrinka BlazingSmith to make him aware.

## 2017-08-28 ENCOUNTER — Telehealth: Payer: Self-pay | Admitting: Interventional Cardiology

## 2017-08-28 NOTE — Telephone Encounter (Signed)
New message    Patient calling to confirm if her dosage needs to be change on losartan-hydrochlorothiazide (HYZAAR) 50-12.5 MG tablet and metoprolol succinate (TOPROL-XL) 100 MG 24 hr tablet. Please call    Pt c/o medication issue:  1. Name of Medication: Losartan and Metoprolol  2. How are you currently taking this medication (dosage and times per day)?as prescribed  3. Are you having a reaction (difficulty breathing--STAT)? No  4. What is your medication issue? Patient states her blood pressure is doing okay. Does not want medication increased.

## 2017-08-28 NOTE — Telephone Encounter (Signed)
So, she is still taking 50/12.5 mg daily? If so, update med list.

## 2017-08-28 NOTE — Telephone Encounter (Signed)
Pt would like to remain on Hyzaar 50/12.5 mg  and check  B/p and will call with reading in 1-2 weeks. Will forward message to Dr Katrinka BlazingSmith for review .Zack Seal/cy

## 2017-08-29 DIAGNOSIS — Z124 Encounter for screening for malignant neoplasm of cervix: Secondary | ICD-10-CM | POA: Diagnosis not present

## 2017-08-29 DIAGNOSIS — Z01419 Encounter for gynecological examination (general) (routine) without abnormal findings: Secondary | ICD-10-CM | POA: Diagnosis not present

## 2017-08-29 DIAGNOSIS — Z6832 Body mass index (BMI) 32.0-32.9, adult: Secondary | ICD-10-CM | POA: Diagnosis not present

## 2017-08-29 NOTE — Telephone Encounter (Signed)
Yes, she is continuing Hyzaar 50/12.5mg  QD.  Med list updated.

## 2017-09-01 ENCOUNTER — Other Ambulatory Visit: Payer: Self-pay | Admitting: Interventional Cardiology

## 2017-09-08 ENCOUNTER — Ambulatory Visit
Admission: RE | Admit: 2017-09-08 | Discharge: 2017-09-08 | Disposition: A | Discharge status: Home / Self Care | Payer: Medicare HMO | Source: Ambulatory Visit | Attending: Obstetrics & Gynecology | Admitting: Obstetrics & Gynecology

## 2017-09-08 DIAGNOSIS — Z1231 Encounter for screening mammogram for malignant neoplasm of breast: Secondary | ICD-10-CM

## 2018-06-20 ENCOUNTER — Encounter: Payer: Self-pay | Admitting: Interventional Cardiology

## 2018-06-20 ENCOUNTER — Ambulatory Visit: Payer: Medicare HMO | Admitting: Interventional Cardiology

## 2018-06-20 VITALS — BP 146/92 | HR 78 | Ht 65.0 in | Wt 194.0 lb

## 2018-06-20 DIAGNOSIS — E785 Hyperlipidemia, unspecified: Secondary | ICD-10-CM

## 2018-06-20 DIAGNOSIS — I480 Paroxysmal atrial fibrillation: Secondary | ICD-10-CM | POA: Diagnosis not present

## 2018-06-20 DIAGNOSIS — I10 Essential (primary) hypertension: Secondary | ICD-10-CM

## 2018-06-20 MED ORDER — LOSARTAN POTASSIUM-HCTZ 50-12.5 MG PO TABS: 1.0000 | ORAL_TABLET | Freq: Every day | ORAL | 3 refills | Status: DC

## 2018-06-20 MED ORDER — METOPROLOL SUCCINATE ER 100 MG PO TB24: 100.0000 mg | ORAL_TABLET | Freq: Every day | ORAL | 3 refills | Status: DC

## 2018-06-20 NOTE — Patient Instructions (Signed)

## 2018-06-20 NOTE — Progress Notes (Signed)
Cardiology Office Note:    Date:  06/20/2018   ID:  [name] [name], DOB September 23, 1945, MRN [id number]  PCP:  Joycelyn Rua, MD  Cardiologist:  Lesleigh Noe, MD   Referring MD: Joycelyn Rua, MD   Chief Complaint  Patient presents with  . Hypertension    History of Present Illness:    [name] [name] is a 72 y.o. female with a hx of history of hypertension, hyperlipidemia and paroxysmal atrial fibrillation (remote).  [name] [name] is under stress because her younger brother has gastric cancer.  Several other family members have been ill lately.  She has no cardiac complaints.  She has not been eating properly.  She has not been watching salt in her diet.  She is not getting aerobic activity as prescribed.  She is sleeping okay.  No chest pain, neurological complaints, or swelling.  Past Medical History:  Diagnosis Date  . Essential (primary) hypertension   . Palpitation   . Paroxysmal atrial fibrillation (HCC)     History reviewed. No pertinent surgical history.  Current Medications: Current Meds  Medication Sig  . aspirin 81 MG tablet Take 81 mg by mouth daily.  . Multiple Vitamins-Minerals (EYE VITAMINS PO) Take 1 tablet by mouth daily.  . [DISCONTINUED] losartan-hydrochlorothiazide (HYZAAR) 50-12.5 MG tablet Take 1 tablet by mouth daily.  . [DISCONTINUED] metoprolol succinate (TOPROL-XL) 100 MG 24 hr tablet TAKE 1 TABLET BY MOUTH EVERY DAY     Allergies:   Patient has no known allergies.   Social History   Socioeconomic History  . Marital status: Married    Spouse name: Not on file  . Number of children: Not on file  . Years of education: Not on file  . Highest education level: Not on file  Occupational History  . Not on file  Social Needs  . Financial resource strain: Not on file  . Food insecurity:    Worry: Not on file    Inability: Not on file  . Transportation needs:    Medical: Not on file    Non-medical: Not on file  Tobacco Use  . Smoking status:  Never Smoker  . Smokeless tobacco: Never Used  Substance and Sexual Activity  . Alcohol use: No    Alcohol/week: 0.0 standard drinks  . Drug use: No  . Sexual activity: Not on file  Lifestyle  . Physical activity:    Days per week: Not on file    Minutes per session: Not on file  . Stress: Not on file  Relationships  . Social connections:    Talks on phone: Not on file    Gets together: Not on file    Attends religious service: Not on file    Active member of club or organization: Not on file    Attends meetings of clubs or organizations: Not on file    Relationship status: Not on file  Other Topics Concern  . Not on file  Social History Narrative  . Not on file     Family History: The patient's family history includes Colon cancer in her father; Diabetes in her brother, brother, and sister; Healthy in her sister and sister; Heart attack in her mother; Heart disease in her brother and mother; Leukemia in her brother.  ROS:   Please see the history of present illness.    Anxiety related to her brother's health but otherwise no complaints.  All other systems reviewed and are negative.  EKGs/Labs/Other Studies Reviewed:  The following studies were reviewed today: No functional or imaging studies within the past 12 months.  EKG:  EKG is  ordered today.  The ekg ordered today demonstrates normal sinus rhythm with left atrial abnormality, otherwise normal.  When compared to June 13, 2017, no changes occurred.  Recent Labs: No results found for requested labs within last 8760 hours.  Recent Lipid Panel No results found for: CHOL, TRIG, HDL, CHOLHDL, VLDL, LDLCALC, LDLDIRECT  Physical Exam:    VS:  BP (!) 146/92   Pulse 78   Ht 5\' 5"  (1.651 m)   Wt 194 lb (88 kg)   BMI 32.28 kg/m     Wt Readings from Last 3 Encounters:  06/20/18 194 lb (88 kg)  06/13/17 196 lb 12.8 oz (89.3 kg)  06/06/16 194 lb (88 kg)     GEN:  Well nourished, well developed in no acute  distress HEENT: Normal NECK: No JVD. LYMPHATICS: No lymphadenopathy CARDIAC: RRR, no murmur, no gallop, no edema. VASCULAR: 2+ bilateral radial and carotid pulses.  No bruits. RESPIRATORY:  Clear to auscultation without rales, wheezing or rhonchi  ABDOMEN: Soft, non-tender, non-distended, No pulsatile mass, MUSCULOSKELETAL: No deformity  SKIN: Warm and dry NEUROLOGIC:  Alert and oriented x 3 PSYCHIATRIC:  Normal affect   ASSESSMENT:    1. Essential hypertension   2. Paroxysmal atrial fibrillation (HCC)   3. Hyperlipidemia LDL goal <100    PLAN:    In order of problems listed above:  1. Blood pressure target 130/80 mmHg.  Initially registered high at 146/92 on check-in.  Repeat supine blood pressure was 134/84 mmHg.  Low-salt diet and moderate aerobic activity greater than 150 minutes/week is recommended. 2. She denies palpitations and has had no clinical recurrence of atrial fibrillation now for greater than 6 years. 3. Lipids are elevated.  Her target LDL should be 100 or less given her risk factor profile.  This needs to be monitored closely and treated with statin therapy if it continues to rise.  We will discuss again on next visit.   Medication Adjustments/Labs and Tests Ordered: Current medicines are reviewed at length with the patient today.  Concerns regarding medicines are outlined above.  Orders Placed This Encounter  Procedures  . EKG 12-Lead   Meds ordered this encounter  Medications  . losartan-hydrochlorothiazide (HYZAAR) 50-12.5 MG tablet    Sig: Take 1 tablet by mouth daily.    Dispense:  90 tablet    Refill:  3  . metoprolol succinate (TOPROL-XL) 100 MG 24 hr tablet    Sig: Take 1 tablet (100 mg total) by mouth daily. Take with or immediately following a meal.    Dispense:  90 tablet    Refill:  3    PT ALMOST OUT NEEDS A NEW RX THANK YOU    Patient Instructions  Medication Instructions:  Your physician recommends that you continue on your current  medications as directed. Please refer to the Current Medication list given to you today.  If you need a refill on your cardiac medications before your next appointment, please call your pharmacy.   Lab work: none If you have labs (blood work) drawn today and your tests are completely normal, you will receive your results only by: Marland Kitchen MyChart Message (if you have MyChart) OR . A paper copy in the mail If you have any lab test that is abnormal or we need to change your treatment, we will call you to review the results.  Testing/Procedures: none  Follow-Up: At [facility], you and your health needs are our priority.  As part of our continuing mission to provide you with exceptional heart care, we have created designated Provider Care Teams.  These Care Teams include your primary Cardiologist (physician) and Advanced Practice Providers (APPs -  Physician Assistants and Nurse Practitioners) who all work together to provide you with the care you need, when you need it. You will need a follow up appointment in 12 months.  Please call our office 2 months in advance to schedule this appointment.  You may see Lesleigh Noe, MD or one of the following Advanced Practice Providers on your designated Care Team:   Norma Fredrickson, NP Nada Boozer, NP . Georgie Chard, NP  Any Other Special Instructions Will Be Listed Below (If Applicable).       Signed, Lesleigh Noe, MD  06/20/2018 6:20 PM    [facility]

## 2018-08-30 DIAGNOSIS — Z6833 Body mass index (BMI) 33.0-33.9, adult: Secondary | ICD-10-CM | POA: Diagnosis not present

## 2018-08-30 DIAGNOSIS — Z01419 Encounter for gynecological examination (general) (routine) without abnormal findings: Secondary | ICD-10-CM | POA: Diagnosis not present

## 2018-09-04 ENCOUNTER — Other Ambulatory Visit: Payer: Self-pay | Admitting: Obstetrics & Gynecology

## 2018-09-04 DIAGNOSIS — Z1231 Encounter for screening mammogram for malignant neoplasm of breast: Secondary | ICD-10-CM

## 2018-09-20 ENCOUNTER — Ambulatory Visit
Admission: RE | Admit: 2018-09-20 | Discharge: 2018-09-20 | Disposition: A | Discharge status: Home / Self Care | Payer: Medicare HMO | Source: Ambulatory Visit | Attending: Obstetrics & Gynecology | Admitting: Obstetrics & Gynecology

## 2018-09-20 DIAGNOSIS — Z1231 Encounter for screening mammogram for malignant neoplasm of breast: Secondary | ICD-10-CM

## 2018-09-21 ENCOUNTER — Other Ambulatory Visit: Payer: Self-pay | Admitting: Obstetrics & Gynecology

## 2018-09-21 DIAGNOSIS — R928 Other abnormal and inconclusive findings on diagnostic imaging of breast: Secondary | ICD-10-CM

## 2018-09-26 ENCOUNTER — Ambulatory Visit
Admission: RE | Admit: 2018-09-26 | Discharge: 2018-09-26 | Disposition: A | Discharge status: Home / Self Care | Payer: Medicare HMO | Source: Ambulatory Visit | Attending: Obstetrics & Gynecology | Admitting: Obstetrics & Gynecology

## 2018-09-26 ENCOUNTER — Other Ambulatory Visit: Payer: Self-pay | Admitting: Obstetrics & Gynecology

## 2018-09-26 DIAGNOSIS — R922 Inconclusive mammogram: Secondary | ICD-10-CM | POA: Diagnosis not present

## 2018-09-26 DIAGNOSIS — R928 Other abnormal and inconclusive findings on diagnostic imaging of breast: Secondary | ICD-10-CM

## 2018-09-26 DIAGNOSIS — N6489 Other specified disorders of breast: Secondary | ICD-10-CM

## 2018-09-26 DIAGNOSIS — N6001 Solitary cyst of right breast: Secondary | ICD-10-CM | POA: Diagnosis not present

## 2018-10-29 DIAGNOSIS — N3001 Acute cystitis with hematuria: Secondary | ICD-10-CM | POA: Diagnosis not present

## 2018-10-29 DIAGNOSIS — R350 Frequency of micturition: Secondary | ICD-10-CM | POA: Diagnosis not present

## 2019-03-28 ENCOUNTER — Other Ambulatory Visit: Payer: Self-pay

## 2019-03-28 ENCOUNTER — Ambulatory Visit: Payer: Medicare HMO

## 2019-03-28 ENCOUNTER — Ambulatory Visit
Admission: RE | Admit: 2019-03-28 | Discharge: 2019-03-28 | Disposition: A | Discharge status: Home / Self Care | Payer: Medicare HMO | Source: Ambulatory Visit | Attending: Obstetrics & Gynecology | Admitting: Obstetrics & Gynecology

## 2019-03-28 DIAGNOSIS — N6489 Other specified disorders of breast: Secondary | ICD-10-CM

## 2019-03-28 DIAGNOSIS — R928 Other abnormal and inconclusive findings on diagnostic imaging of breast: Secondary | ICD-10-CM | POA: Diagnosis not present

## 2019-04-12 IMAGING — MG DIGITAL SCREENING BILATERAL MAMMOGRAM WITH CAD
5 series · 5 of 5 positions shown · non-contrast
Comparison: Previous exam(s).

CLINICAL DATA: Screening.

EXAM:
DIGITAL SCREENING BILATERAL MAMMOGRAM WITH CAD

[L CC]
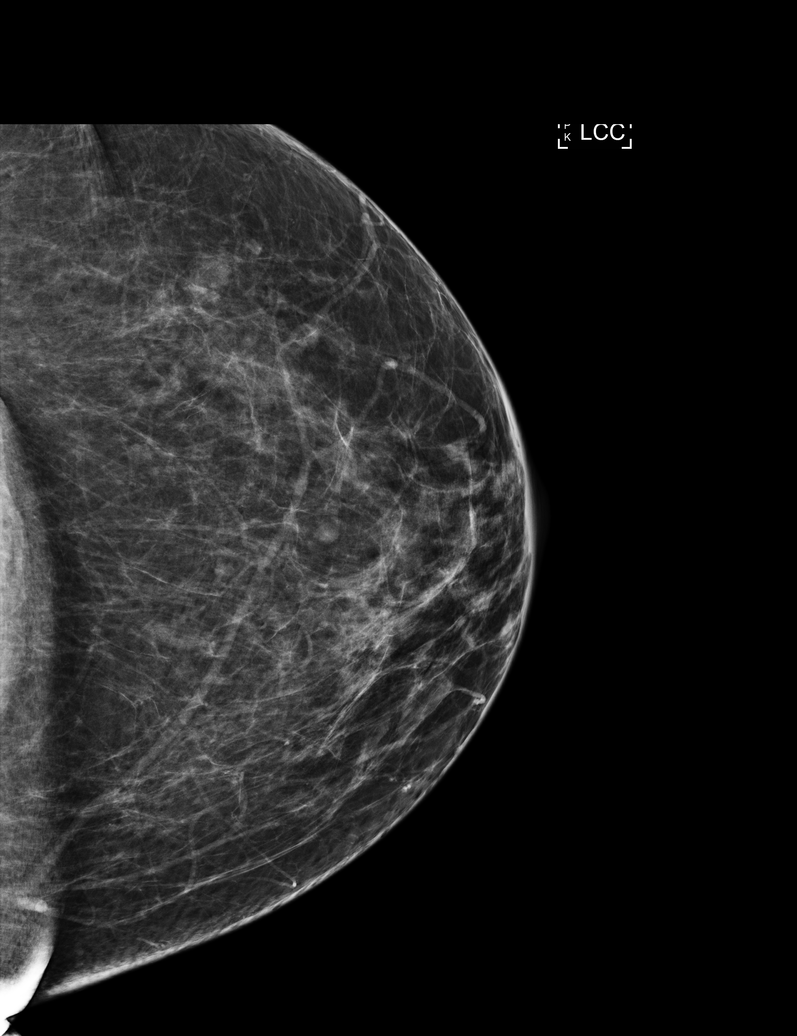

[R CC]
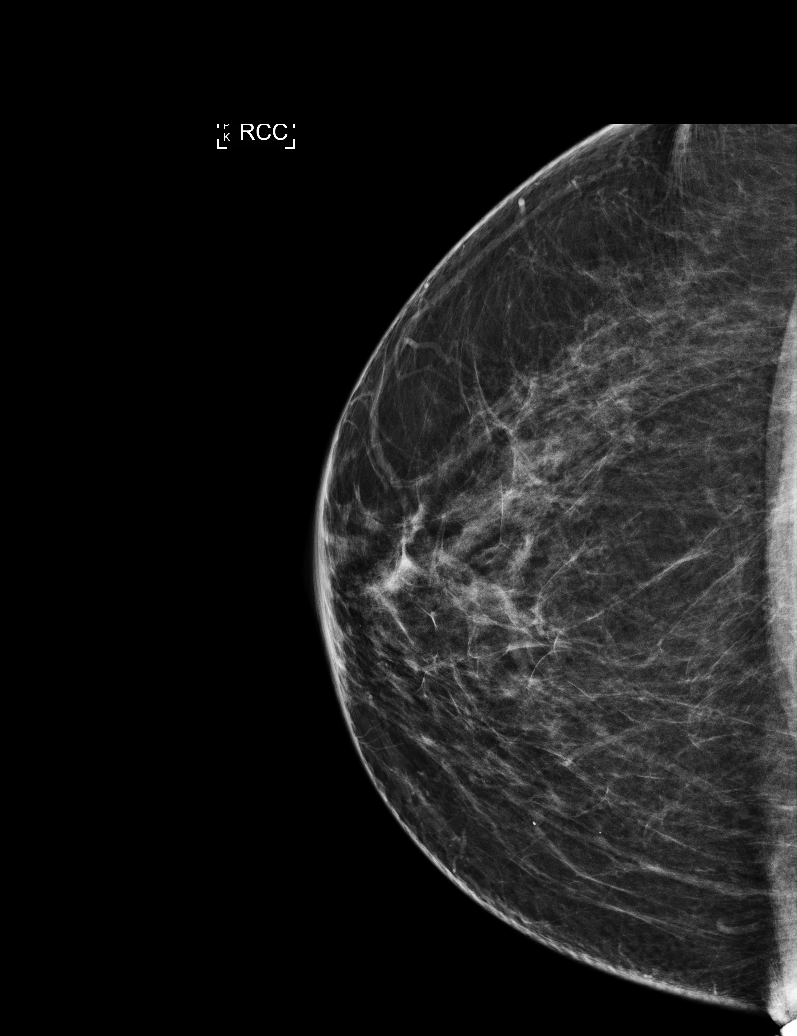

[L MLO (1 of 2)]
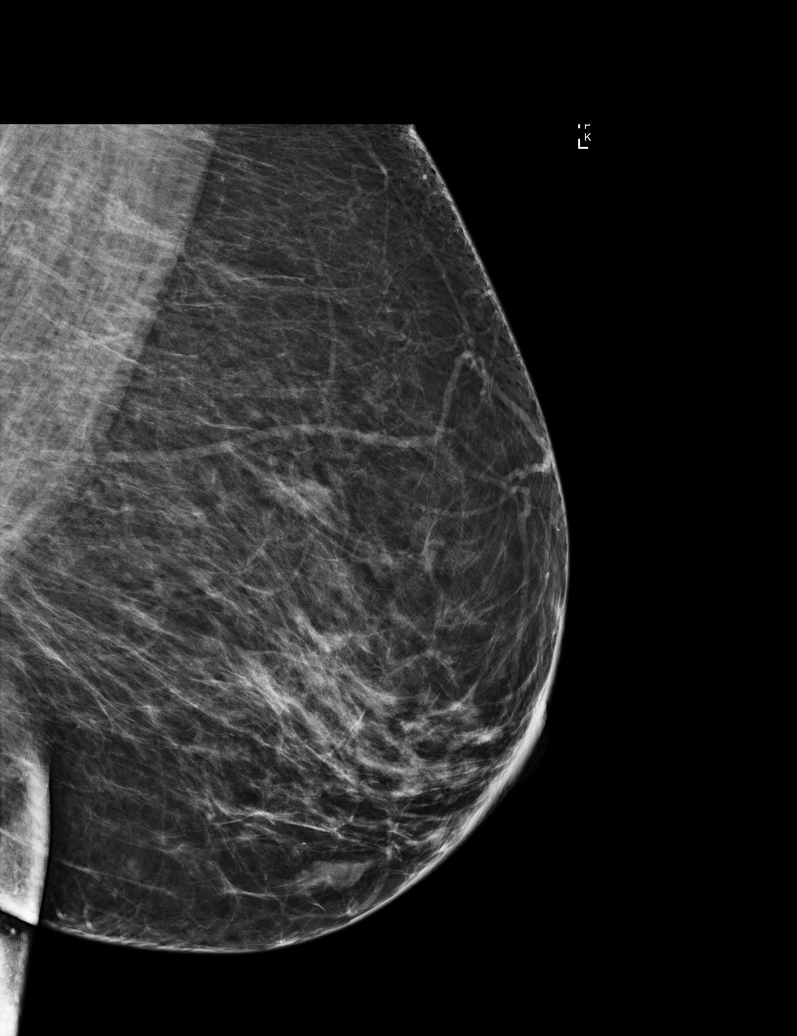

[L MLO (2 of 2)]
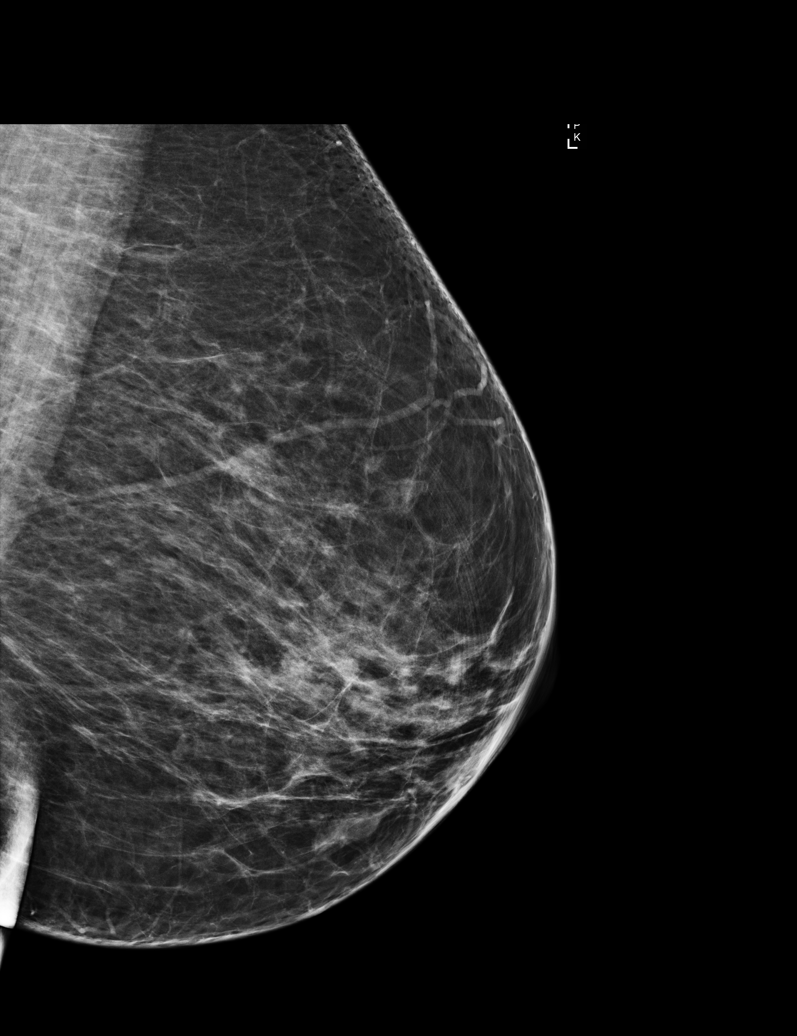

[R MLO]
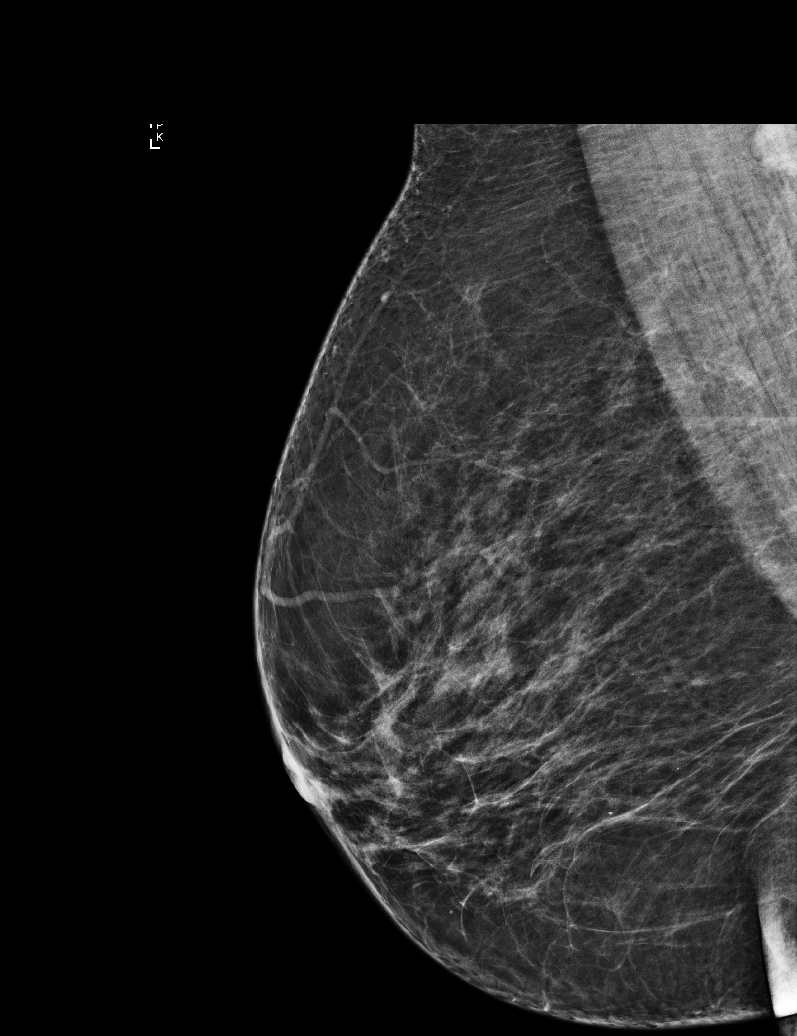

[5 of 5 positions shown; findings below may reference images not displayed]

ACR Breast Density Category b: There are scattered areas of
fibroglandular density.
FINDINGS: In the left breast, a possible asymmetry warrants further
evaluation. This possible asymmetry seen within the upper LEFT
breast, at middle depth, MLO view only.

In the right breast, no findings suspicious for malignancy. Images
were processed with CAD.
IMPRESSION: Further evaluation is suggested for possible asymmetry in the left
breast.

RECOMMENDATION:
Diagnostic mammogram and possibly ultrasound of the left breast.
(Code:QO-X-NNA)

The patient will be contacted regarding the findings, and additional
imaging will be scheduled.

BI-RADS CATEGORY  0: Incomplete. Need additional imaging evaluation
and/or prior mammograms for comparison.

## 2019-06-22 ENCOUNTER — Other Ambulatory Visit: Payer: Self-pay | Admitting: Interventional Cardiology

## 2019-06-23 ENCOUNTER — Other Ambulatory Visit: Payer: Self-pay | Admitting: Interventional Cardiology

## 2019-07-22 NOTE — Progress Notes (Signed)
Cardiology Office Note:    Date:  07/25/2019   ID:  [name], [name] 08-29-1945, MRN [id number]  PCP:  Orpah Melter, MD  Cardiologist:  Sinclair Grooms, MD   Referring MD: Orpah Melter, MD   Chief Complaint  Patient presents with  . Hypertension    History of Present Illness:    [name] [name] is a 73 y.o. female with a hx of hypertension, hyperlipidemia and paroxysmal atrial fibrillation (remote).  He has no complaints.  She denies chest pain, shortness of breath, lower extremity swelling, orthopnea, PND, palpitations, and exertional intolerance.  She has continued to exercise.  She has gained weight.  Past Medical History:  Diagnosis Date  . Essential (primary) hypertension   . Palpitation   . Paroxysmal atrial fibrillation ([place])     History reviewed. No pertinent surgical history.  Current Medications: Current Meds  Medication Sig  . aspirin 81 MG tablet Take 81 mg by mouth daily.  Marland Kitchen losartan-hydrochlorothiazide (HYZAAR) 50-12.5 MG tablet TAKE 1 TABLET BY MOUTH EVERY DAY  . metoprolol succinate (TOPROL-XL) 100 MG 24 hr tablet TAKE 1 TABLET (100 MG TOTAL) BY MOUTH DAILY. TAKE WITH OR IMMEDIATELY FOLLOWING A MEAL.  . Multiple Vitamins-Minerals (EYE VITAMINS PO) Take 1 tablet by mouth daily.     Allergies:   Patient has no known allergies.   Social History   Socioeconomic History  . Marital status: Married    Spouse name: Not on file  . Number of children: Not on file  . Years of education: Not on file  . Highest education level: Not on file  Occupational History  . Not on file  Social Needs  . Financial resource strain: Not on file  . Food insecurity    Worry: Not on file    Inability: Not on file  . Transportation needs    Medical: Not on file    Non-medical: Not on file  Tobacco Use  . Smoking status: Never Smoker  . Smokeless tobacco: Never Used  Substance and Sexual Activity  . Alcohol use: No    Alcohol/week: 0.0 standard drinks   . Drug use: No  . Sexual activity: Not on file  Lifestyle  . Physical activity    Days per week: Not on file    Minutes per session: Not on file  . Stress: Not on file  Relationships  . Social Herbalist on phone: Not on file    Gets together: Not on file    Attends religious service: Not on file    Active member of club or organization: Not on file    Attends meetings of clubs or organizations: Not on file    Relationship status: Not on file  Other Topics Concern  . Not on file  Social History Narrative  . Not on file     Family History: The patient's family history includes Colon cancer in her father; Diabetes in her brother, brother, and sister; Healthy in her sister and sister; Heart attack in her mother; Heart disease in her brother and mother; Leukemia in her brother.  ROS:   Please see the history of present illness.    Compliant with medications.  All other systems reviewed and are negative.  EKGs/Labs/Other Studies Reviewed:    The following studies were reviewed today: No new data  EKG:  EKG the electrocardiogram performed today, December 3 is normal with biatrial abnormality, and incomplete right bundle.  When compared to the prior  tracing from 1 year ago, no changes noted.  Recent Labs: No results found for requested labs within last 8760 hours.  Recent Lipid Panel No results found for: CHOL, TRIG, HDL, CHOLHDL, VLDL, LDLCALC, LDLDIRECT  Physical Exam:    VS:  BP (!) 142/78   Pulse 86   Ht 5\' 5"  (1.651 m)   Wt 200 lb 12.8 oz (91.1 kg)   SpO2 98%   BMI 33.41 kg/m     Wt Readings from Last 3 Encounters:  07/25/19 200 lb 12.8 oz (91.1 kg)  06/20/18 194 lb (88 kg)  06/13/17 196 lb 12.8 oz (89.3 kg)     GEN: Moderate obesity. No acute distress HEENT: Normal NECK: No JVD. LYMPHATICS: No lymphadenopathy CARDIAC:  RRR without murmur, gallop, or edema. VASCULAR:  Normal Pulses. No bruits. RESPIRATORY:  Clear to auscultation without rales,  wheezing or rhonchi  ABDOMEN: Soft, non-tender, non-distended, No pulsatile mass, MUSCULOSKELETAL: No deformity  SKIN: Warm and dry NEUROLOGIC:  Alert and oriented x 3 PSYCHIATRIC:  Normal affect   ASSESSMENT:    1. Paroxysmal atrial fibrillation (HCC)   2. Hyperlipidemia LDL goal <100   3. Essential hypertension   4. Educated about COVID-19 virus infection    PLAN:    In order of problems listed above:  1. No recurrence of atrial fibrillation 2. Upcoming appointment with primary physician where LDL will be checked.  I have been lobbying to treat.  She is reluctant about taking on additional medication.  Last LDL done in 2018 was 112 3. Blood pressure as usual is mildly elevated.  She feels it may be related to the circumstances.  At her medication only 1-1/2 hours ago.  I have asked her to invest in a blood pressure cuff and to measure her blood pressure 2 to 3 hours after her medications have been taken to give me a out of office perspective for blood pressure control.  She should measure it at least 2-3 times per month. 4. The 3W's is observed and being practiced.  Overall education and awareness concerning primary risk prevention was discussed in detail: LDL less than 70, hemoglobin A1c less than 7, blood pressure target less than 130/80 mmHg, >150 minutes of moderate aerobic activity per week, avoidance of smoking, weight control (via diet and exercise), and continued surveillance/management of/for obstructive sleep apnea.    Medication Adjustments/Labs and Tests Ordered: Current medicines are reviewed at length with the patient today.  Concerns regarding medicines are outlined above.  Orders Placed This Encounter  Procedures  . EKG 12-Lead   No orders of the defined types were placed in this encounter.   Patient Instructions  Medication Instructions:  Your physician recommends that you continue on your current medications as directed. Please refer to the Current Medication  list given to you today.  *If you need a refill on your cardiac medications before your next appointment, please call your pharmacy*  Lab Work: None If you have labs (blood work) drawn today and your tests are completely normal, you will receive your results only by: 2019 MyChart Message (if you have MyChart) OR . A paper copy in the mail If you have any lab test that is abnormal or we need to change your treatment, we will call you to review the results.  Testing/Procedures: None  Follow-Up: At [facility], you and your health needs are our priority.  As part of our continuing mission to provide you with exceptional heart care, we have created designated Provider  Care Teams.  These Care Teams include your primary Cardiologist (physician) and Advanced Practice Providers (APPs -  Physician Assistants and Nurse Practitioners) who all work together to provide you with the care you need, when you need it.  Your next appointment:   12 month(s)  The format for your next appointment:   In Person  Provider:   You may see Lesleigh NoeHenry W Tashonna Descoteaux III, MD or one of the following Advanced Practice Providers on your designated Care Team:    Norma FredricksonLori Gerhardt, NP  Nada BoozerLaura Ingold, NP  Georgie ChardJill McDaniel, NP   Other Instructions  Your provider recommends that you maintain 150 minutes per week of moderate aerobic activity.  Monitor your blood pressure at least 2-3 times a month.  Contact us with those readings in about a month.  Make sure      Signed, Lesleigh NoeHenry W Almarie Kurdziel III, MD  07/25/2019 9:49 AM    [facility]

## 2019-07-25 ENCOUNTER — Encounter: Payer: Self-pay | Admitting: Interventional Cardiology

## 2019-07-25 ENCOUNTER — Other Ambulatory Visit: Payer: Self-pay

## 2019-07-25 ENCOUNTER — Ambulatory Visit: Payer: Medicare HMO | Admitting: Interventional Cardiology

## 2019-07-25 VITALS — BP 142/78 | HR 86 | Ht 65.0 in | Wt 200.8 lb

## 2019-07-25 DIAGNOSIS — I480 Paroxysmal atrial fibrillation: Secondary | ICD-10-CM | POA: Diagnosis not present

## 2019-07-25 DIAGNOSIS — E785 Hyperlipidemia, unspecified: Secondary | ICD-10-CM | POA: Diagnosis not present

## 2019-07-25 DIAGNOSIS — Z7189 Other specified counseling: Secondary | ICD-10-CM

## 2019-07-25 DIAGNOSIS — I10 Essential (primary) hypertension: Secondary | ICD-10-CM

## 2019-07-25 NOTE — Patient Instructions (Addendum)
Medication Instructions:  Your physician recommends that you continue on your current medications as directed. Please refer to the Current Medication list given to you today.  *If you need a refill on your cardiac medications before your next appointment, please call your pharmacy*  Lab Work: None If you have labs (blood work) drawn today and your tests are completely normal, you will receive your results only by: Marland Kitchen MyChart Message (if you have MyChart) OR . A paper copy in the mail If you have any lab test that is abnormal or we need to change your treatment, we will call you to review the results.  Testing/Procedures: None  Follow-Up: At [facility], you and your health needs are our priority.  As part of our continuing mission to provide you with exceptional heart care, we have created designated Provider Care Teams.  These Care Teams include your primary Cardiologist (physician) and Advanced Practice Providers (APPs -  Physician Assistants and Nurse Practitioners) who all work together to provide you with the care you need, when you need it.  Your next appointment:   12 month(s)  The format for your next appointment:   In Person  Provider:   You may see Sinclair Grooms, MD or one of the following Advanced Practice Providers on your designated Care Team:    Truitt Merle, NP  Cecilie Kicks, NP  Kathyrn Drown, NP   Other Instructions  Your provider recommends that you maintain 150 minutes per week of moderate aerobic activity.  Monitor your blood pressure at least 2-3 times a month.  Contact us with those readings in about a month.  Make sure you take the reading at least 2 hours after your medications.

## 2019-09-02 DIAGNOSIS — Z6833 Body mass index (BMI) 33.0-33.9, adult: Secondary | ICD-10-CM | POA: Diagnosis not present

## 2019-09-02 DIAGNOSIS — Z01419 Encounter for gynecological examination (general) (routine) without abnormal findings: Secondary | ICD-10-CM | POA: Diagnosis not present

## 2019-09-02 DIAGNOSIS — Z124 Encounter for screening for malignant neoplasm of cervix: Secondary | ICD-10-CM | POA: Diagnosis not present

## 2019-09-17 ENCOUNTER — Other Ambulatory Visit: Payer: Self-pay | Admitting: Interventional Cardiology

## 2020-06-16 ENCOUNTER — Other Ambulatory Visit: Payer: Self-pay | Admitting: Interventional Cardiology

## 2020-07-08 DIAGNOSIS — Z0000 Encounter for general adult medical examination without abnormal findings: Secondary | ICD-10-CM | POA: Diagnosis not present

## 2020-07-08 DIAGNOSIS — I10 Essential (primary) hypertension: Secondary | ICD-10-CM | POA: Diagnosis not present

## 2020-07-08 DIAGNOSIS — I480 Paroxysmal atrial fibrillation: Secondary | ICD-10-CM | POA: Diagnosis not present

## 2020-07-08 DIAGNOSIS — Z136 Encounter for screening for cardiovascular disorders: Secondary | ICD-10-CM | POA: Diagnosis not present

## 2020-07-08 DIAGNOSIS — E669 Obesity, unspecified: Secondary | ICD-10-CM | POA: Diagnosis not present

## 2020-08-30 NOTE — Progress Notes (Signed)
Cardiology Office Note:    Date:  08/31/2020   ID:  [name] [name], [name] [name] 1946-04-01, MRN [id number]  PCP:  Joycelyn Rua, MD  Cardiologist:  Lesleigh Noe, MD   Referring MD: Joycelyn Rua, MD   Chief Complaint  Patient presents with  . Hypertension  . Hyperlipidemia    History of Present Illness:    [name] [name] is a 75 y.o. female with a hx of hypertension, hyperlipidemiaand paroxysmal atrial fibrillation (remote).  [name] [name] is doing well and has no complaints.  She is compliant with her medical regimen.  Made aware of an elevated LDL cholesterol of 135 with a triglyceride of 140.  HDL is 42.  She was offered therapy by her primary physician but refused.  She denies claudication, chest pain, transient neurological symptoms, headache, orthopnea, and PND.  She does clarify for me that her mother had stroke.  Brother had heart failure and AICD after an MI.  Past Medical History:  Diagnosis Date  . Essential (primary) hypertension   . Palpitation   . Paroxysmal atrial fibrillation (HCC)     History reviewed. No pertinent surgical history.  Current Medications: Current Meds  Medication Sig  . aspirin 81 MG tablet Take 81 mg by mouth every other day.  . losartan-hydrochlorothiazide (HYZAAR) 50-12.5 MG tablet TAKE 1 TABLET BY MOUTH EVERY DAY  . metoprolol succinate (TOPROL-XL) 100 MG 24 hr tablet Take 1 tablet (100 mg total) by mouth daily. Take with or immediately following a meal. Please keep upcoming appt in January 2022 with Dr. Katrinka Blazing for future refills. Thank you  . Multiple Vitamins-Minerals (EYE VITAMINS PO) Take 1 tablet by mouth daily.     Allergies:   Patient has no known allergies.   Social History   Socioeconomic History  . Marital status: Married    Spouse name: Not on file  . Number of children: Not on file  . Years of education: Not on file  . Highest education level: Not on file  Occupational History  . Not on file  Tobacco Use  .  Smoking status: Never Smoker  . Smokeless tobacco: Never Used  Substance and Sexual Activity  . Alcohol use: No    Alcohol/week: 0.0 standard drinks  . Drug use: No  . Sexual activity: Not on file  Other Topics Concern  . Not on file  Social History Narrative  . Not on file   Social Determinants of Health   Financial Resource Strain: Not on file  Food Insecurity: Not on file  Transportation Needs: Not on file  Physical Activity: Not on file  Stress: Not on file  Social Connections: Not on file     Family History: The patient's family history includes Colon cancer in her father; Diabetes in her brother, brother, and sister; Healthy in her sister and sister; Heart attack in her mother; Heart disease in her brother and mother; Leukemia in her brother.  ROS:   Please see the history of present illness.    No complaints.  All other systems reviewed and are negative.  EKGs/Labs/Other Studies Reviewed:    The following studies were reviewed today: No new data  EKG:  EKG normal sinus rhythm with incomplete right bundle.  Recent Labs: No results found for requested labs within last 8760 hours.  Recent Lipid Panel No results found for: CHOL, TRIG, HDL, CHOLHDL, VLDL, LDLCALC, LDLDIRECT  Physical Exam:    VS:  BP 130/76   Pulse 71   Ht 5'  5" (1.651 m)   Wt 196 lb (88.9 kg)   SpO2 96%   BMI 32.62 kg/m     Wt Readings from Last 3 Encounters:  08/31/20 196 lb (88.9 kg)  07/25/19 200 lb 12.8 oz (91.1 kg)  06/20/18 194 lb (88 kg)     GEN: Overweight. No acute distress HEENT: Normal NECK: No JVD. LYMPHATICS: No lymphadenopathy CARDIAC: No murmur. RRR no gallop, or edema. VASCULAR:  Normal Pulses. No bruits. RESPIRATORY:  Clear to auscultation without rales, wheezing or rhonchi  ABDOMEN: Soft, non-tender, non-distended, No pulsatile mass, MUSCULOSKELETAL: No deformity  SKIN: Warm and dry NEUROLOGIC:  Alert and oriented x 3 PSYCHIATRIC:  Normal affect   ASSESSMENT:     1. Essential hypertension   2. Hyperlipidemia LDL goal <100   3. Paroxysmal atrial fibrillation (HCC)   4. Educated about COVID-19 virus infection    PLAN:    In order of problems listed above:  1. Controlled.  Continue same therapy. 2. Untreated.  We will get a coronary calcium score given family history and also personal history of hypertension. 3. No clinical recurrence. 4. Vaccinated and practicing social distancing.  Boosted.   Medication Adjustments/Labs and Tests Ordered: Current medicines are reviewed at length with the patient today.  Concerns regarding medicines are outlined above.  Orders Placed This Encounter  Procedures  . CT CARDIAC SCORING (SELF PAY ONLY)  . EKG 12-Lead   No orders of the defined types were placed in this encounter.   Patient Instructions  Medication Instructions:  Your physician recommends that you continue on your current medications as directed. Please refer to the Current Medication list given to you today.  *If you need a refill on your cardiac medications before your next appointment, please call your pharmacy*   Lab Work: None If you have labs (blood work) drawn today and your tests are completely normal, you will receive your results only by: Marland Kitchen MyChart Message (if you have MyChart) OR . A paper copy in the mail If you have any lab test that is abnormal or we need to change your treatment, we will call you to review the results.   Testing/Procedures: Your physician recommends that you have a Calcium Score performed.   Follow-Up: At [facility], you and your health needs are our priority.  As part of our continuing mission to provide you with exceptional heart care, we have created designated Provider Care Teams.  These Care Teams include your primary Cardiologist (physician) and Advanced Practice Providers (APPs -  Physician Assistants and Nurse Practitioners) who all work together to provide you with the care you need, when  you need it.  We recommend signing up for the patient portal called "MyChart".  Sign up information is provided on this After Visit Summary.  MyChart is used to connect with patients for Virtual Visits (Telemedicine).  Patients are able to view lab/test results, encounter notes, upcoming appointments, etc.  Non-urgent messages can be sent to your provider as well.   To learn more about what you can do with MyChart, go to [email or web address].    Your next appointment:   1 year(s)  The format for your next appointment:   In Person  Provider:   You may see Lesleigh Noe, MD or one of the following Advanced Practice Providers on your designated Care Team:    Norma Fredrickson, NP  Nada Boozer, NP  Georgie Chard, NP    Other Instructions  Your provider recommends that you  maintain 150 minutes per week of moderate aerobic activity.      Signed, Lesleigh Noe, MD  08/31/2020 9:13 AM    [facility]

## 2020-08-31 ENCOUNTER — Ambulatory Visit: Payer: Medicare HMO | Admitting: Interventional Cardiology

## 2020-08-31 ENCOUNTER — Encounter: Payer: Self-pay | Admitting: Interventional Cardiology

## 2020-08-31 ENCOUNTER — Other Ambulatory Visit: Payer: Self-pay

## 2020-08-31 VITALS — BP 130/76 | HR 71 | Ht 65.0 in | Wt 196.0 lb

## 2020-08-31 DIAGNOSIS — Z7189 Other specified counseling: Secondary | ICD-10-CM | POA: Diagnosis not present

## 2020-08-31 DIAGNOSIS — E785 Hyperlipidemia, unspecified: Secondary | ICD-10-CM

## 2020-08-31 DIAGNOSIS — I10 Essential (primary) hypertension: Secondary | ICD-10-CM

## 2020-08-31 DIAGNOSIS — I480 Paroxysmal atrial fibrillation: Secondary | ICD-10-CM | POA: Diagnosis not present

## 2020-08-31 MED ORDER — METOPROLOL SUCCINATE ER 100 MG PO TB24: 100.0000 mg | ORAL_TABLET | Freq: Every day | ORAL | 0 refills | Status: DC

## 2020-08-31 MED ORDER — LOSARTAN POTASSIUM-HCTZ 50-12.5 MG PO TABS: 1.0000 | ORAL_TABLET | Freq: Every day | ORAL | 3 refills | Status: DC

## 2020-08-31 NOTE — Patient Instructions (Signed)
Medication Instructions:  Your physician recommends that you continue on your current medications as directed. Please refer to the Current Medication list given to you today.  *If you need a refill on your cardiac medications before your next appointment, please call your pharmacy*   Lab Work: None If you have labs (blood work) drawn today and your tests are completely normal, you will receive your results only by: Marland Kitchen MyChart Message (if you have MyChart) OR . A paper copy in the mail If you have any lab test that is abnormal or we need to change your treatment, we will call you to review the results.   Testing/Procedures: Your physician recommends that you have a Calcium Score performed.   Follow-Up: At [facility], you and your health needs are our priority.  As part of our continuing mission to provide you with exceptional heart care, we have created designated Provider Care Teams.  These Care Teams include your primary Cardiologist (physician) and Advanced Practice Providers (APPs -  Physician Assistants and Nurse Practitioners) who all work together to provide you with the care you need, when you need it.  We recommend signing up for the patient portal called "MyChart".  Sign up information is provided on this After Visit Summary.  MyChart is used to connect with patients for Virtual Visits (Telemedicine).  Patients are able to view lab/test results, encounter notes, upcoming appointments, etc.  Non-urgent messages can be sent to your provider as well.   To learn more about what you can do with MyChart, go to [email or web address].    Your next appointment:   1 year(s)  The format for your next appointment:   In Person  Provider:   You may see Lesleigh Noe, MD or one of the following Advanced Practice Providers on your designated Care Team:    Norma Fredrickson, NP  Nada Boozer, NP  Georgie Chard, NP    Other Instructions  Your provider recommends that you  maintain 150 minutes per week of moderate aerobic activity.

## 2020-09-03 DIAGNOSIS — Z6833 Body mass index (BMI) 33.0-33.9, adult: Secondary | ICD-10-CM | POA: Diagnosis not present

## 2020-09-03 DIAGNOSIS — Z01419 Encounter for gynecological examination (general) (routine) without abnormal findings: Secondary | ICD-10-CM | POA: Diagnosis not present

## 2020-09-04 ENCOUNTER — Other Ambulatory Visit: Payer: Self-pay

## 2020-09-04 ENCOUNTER — Ambulatory Visit (INDEPENDENT_AMBULATORY_CARE_PROVIDER_SITE_OTHER)
Admission: RE | Admit: 2020-09-04 | Discharge: 2020-09-04 | Disposition: A | Discharge status: Home / Self Care | Payer: Self-pay | Source: Ambulatory Visit | Attending: Interventional Cardiology | Admitting: Interventional Cardiology

## 2020-09-04 DIAGNOSIS — I10 Essential (primary) hypertension: Secondary | ICD-10-CM

## 2020-09-04 DIAGNOSIS — E785 Hyperlipidemia, unspecified: Secondary | ICD-10-CM

## 2020-12-07 ENCOUNTER — Other Ambulatory Visit: Payer: Self-pay | Admitting: Interventional Cardiology

## 2021-03-27 IMAGING — CT CT CARDIAC CORONARY ARTERY CALCIUM SCORE
3 series · 14 of 20 positions shown, 15 images · non-contrast
Comparison: None.
COMPARISON: None.

Addendum:
EXAM:
OVER-READ INTERPRETATION  CT CHEST

The following report is an over-read performed by radiologist Dr.
Fautino Gulino [REDACTED] on 09/04/2020. This over-read
does not include interpretation of cardiac or coronary anatomy or
pathology. The calcium score interpretation by the cardiologist is
attached.
CLINICAL DATA: Risk stratification
Coronary Calcium Score
TECHNIQUE: The patient was scanned on a Siemens Somatom 64 slice scanner. Axial
non-contrast 3 mm slices were carried out through the heart. The
data set was analyzed on a dedicated work station and scored using
the Agatson method.

[Series 2: casc 3.0 bv41 2 bestsyst 34 % · axial · 0.37mm/px · z∈[-204,-132]mm · 4 of 41 slices shown, 5 images]
[im 9/41  vessel]
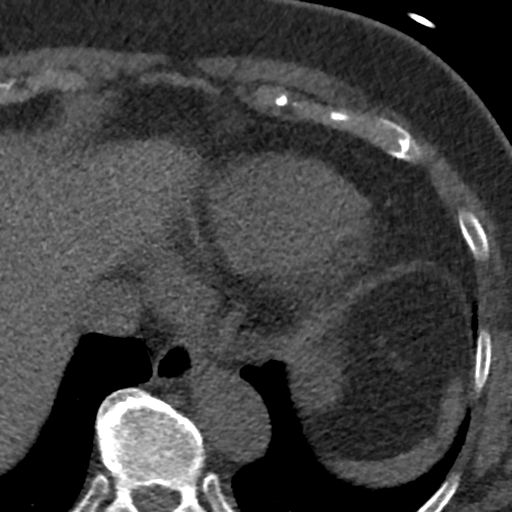
[im 9/41  lung]
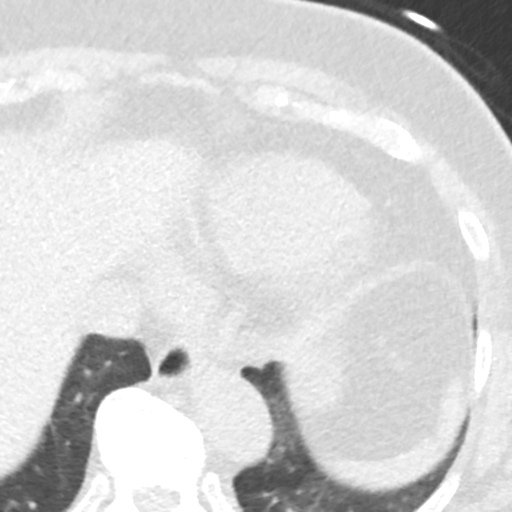
[im 17/41  vessel]
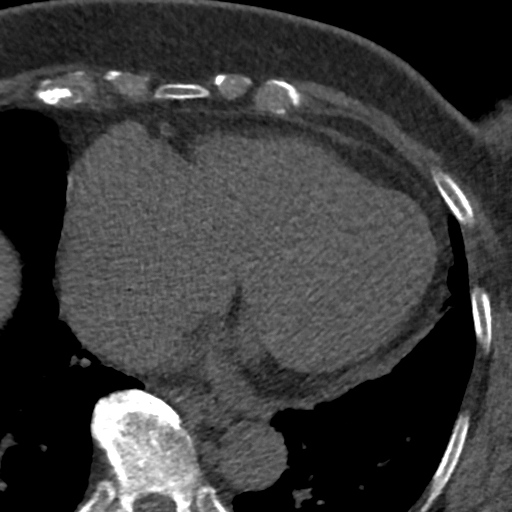
[im 25/41  vessel]
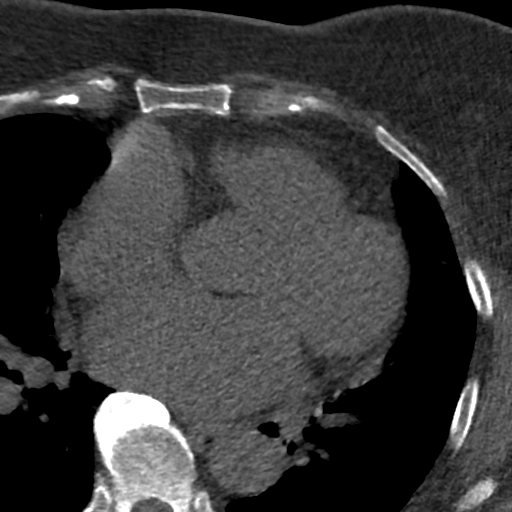
[im 33/41  vessel]
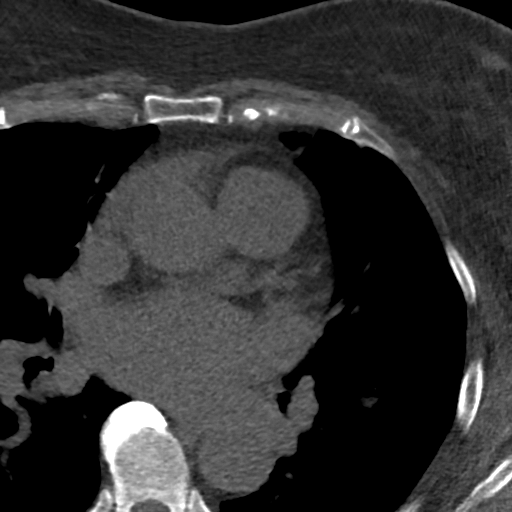

[Series 3: lung 35 % · axial · 0.65mm/px · z∈[-210,-128]mm · 5 of 41 slices shown]
[im 7/41  lung]
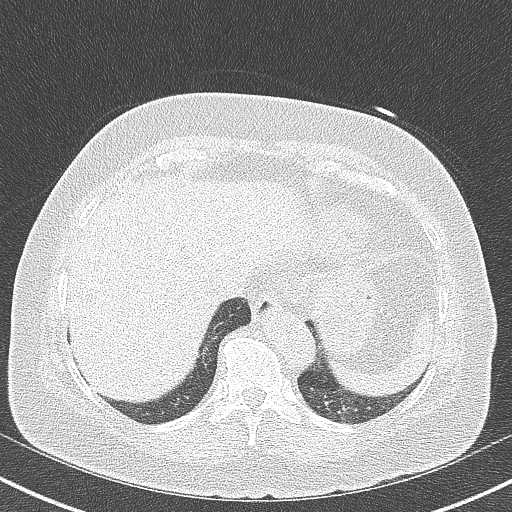
[im 14/41  lung]
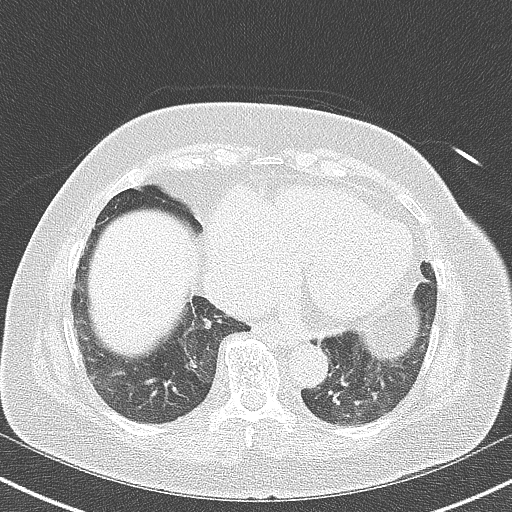
[im 21/41  lung]
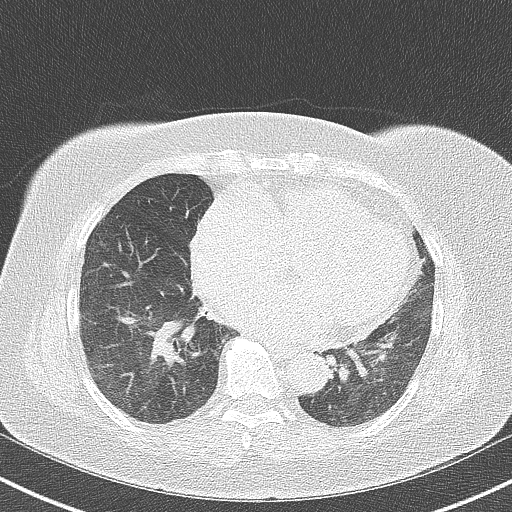
[im 27/41  lung]
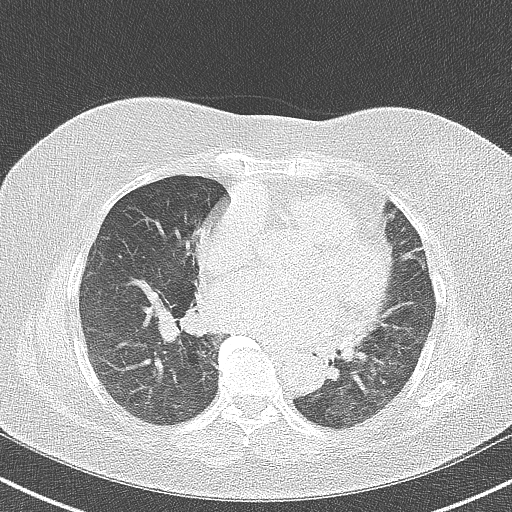
[im 34/41  lung]
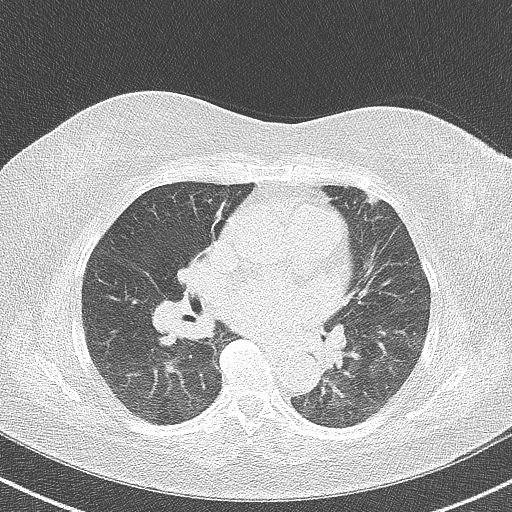

[Series 4: lung st 35 % · axial · 0.65mm/px · z∈[-210,-128]mm · 5 of 41 slices shown]
[im 7/41  lung]
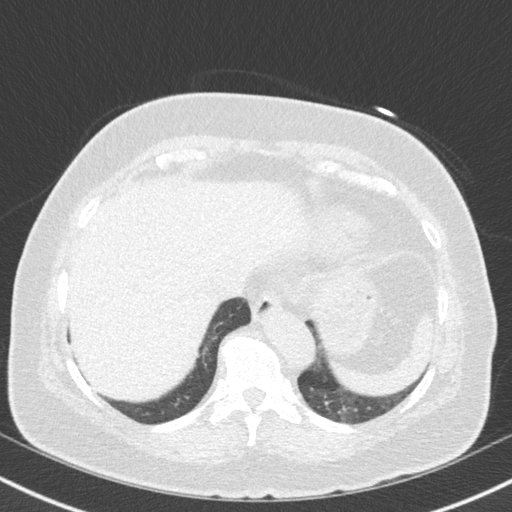
[im 14/41  lung]
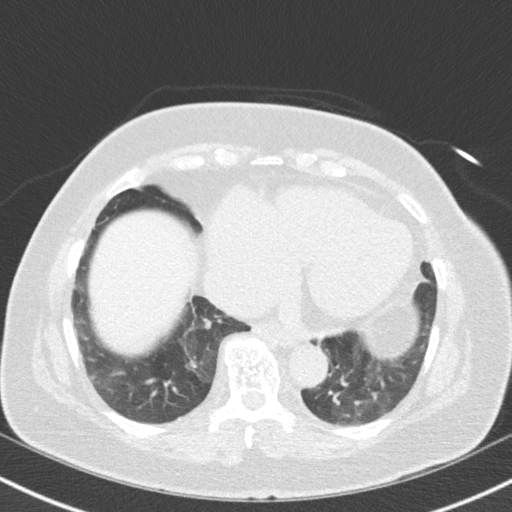
[im 21/41  lung]
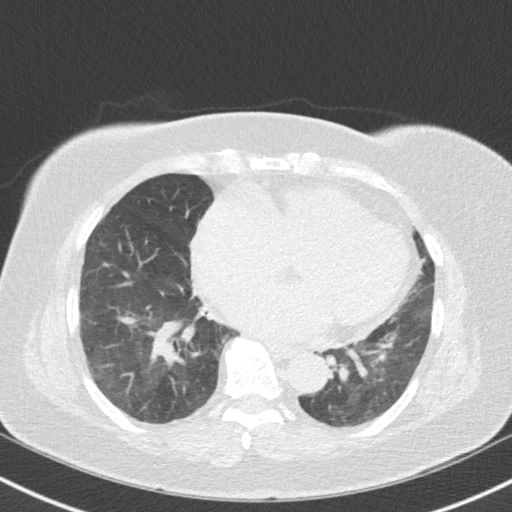
[im 27/41  lung]
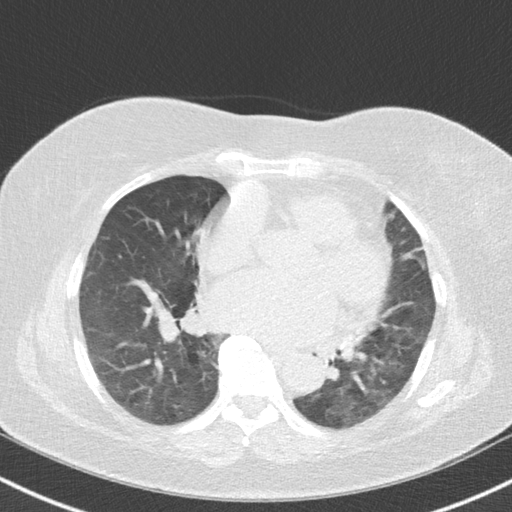
[im 34/41  lung]
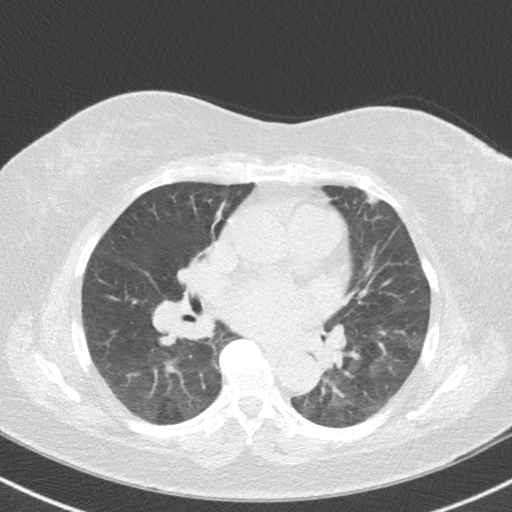

[14 of 20 positions shown; findings below may reference images not displayed]

FINDINGS: Vascular: Normal aortic caliber. Pulmonary artery enlargement,
outflow tract 3.1 cm.

Mediastinum/Nodes: No imaged thoracic adenopathy.

Lungs/Pleura: No pleural fluid. Subpleural right lower lobe
pulmonary nodules, including at 3 mm on [DATE] and [DATE].

Upper Abdomen: Subcentimeter high right hepatic lobe low-density
lesion is likely a cyst. Normal imaged portions of the spleen,
stomach.

Musculoskeletal: No acute osseous abnormality.
IMPRESSION: 1.  No acute findings in the imaged extracardiac chest.
2. Pulmonary artery enlargement suggests pulmonary arterial
hypertension.
3. Subpleural right lower lobe tiny pulmonary nodules are most
likely subpleural lymph nodes. No follow-up needed if patient is
low-risk. Non-contrast chest CT can be considered in 12 months if
patient is high-risk. This recommendation follows the consensus
statement: Guidelines for Management of Incidental Pulmonary Nodules
Detected on CT Images: From the [HOSPITAL] 8906; Radiology
FINDINGS: Non-cardiac: See separate report from [REDACTED].

Ascending aorta: Normal size; mild atherosclerosis descending aorta

Pulmonary artery: Mild enlarged (32 mm)

Pericardium: Normal

Coronary arteries: Normal origin
IMPRESSION: Coronary calcium score of 0. This was 0 percentile for age and sex
matched control.

Mild aortic atherosclerosis.

Mildly dilated pulmonary artery suggestive of pulmonary
hypertension.

Miyah Howze

*** End of Addendum ***
EXAM:
OVER-READ INTERPRETATION  CT CHEST

The following report is an over-read performed by radiologist Dr.
Fautino Gulino [REDACTED] on 09/04/2020. This over-read
does not include interpretation of cardiac or coronary anatomy or
pathology. The calcium score interpretation by the cardiologist is
attached.
FINDINGS: Vascular: Normal aortic caliber. Pulmonary artery enlargement,
outflow tract 3.1 cm.

Mediastinum/Nodes: No imaged thoracic adenopathy.

Lungs/Pleura: No pleural fluid. Subpleural right lower lobe
pulmonary nodules, including at 3 mm on [DATE] and [DATE].

Upper Abdomen: Subcentimeter high right hepatic lobe low-density
lesion is likely a cyst. Normal imaged portions of the spleen,
stomach.

Musculoskeletal: No acute osseous abnormality.
IMPRESSION: 1.  No acute findings in the imaged extracardiac chest.
2. Pulmonary artery enlargement suggests pulmonary arterial
hypertension.
3. Subpleural right lower lobe tiny pulmonary nodules are most
likely subpleural lymph nodes. No follow-up needed if patient is
low-risk. Non-contrast chest CT can be considered in 12 months if
patient is high-risk. This recommendation follows the consensus
statement: Guidelines for Management of Incidental Pulmonary Nodules
Detected on CT Images: From the [HOSPITAL] 8906; Radiology

## 2021-08-21 ENCOUNTER — Other Ambulatory Visit: Payer: Self-pay | Admitting: Interventional Cardiology

## 2021-09-06 ENCOUNTER — Other Ambulatory Visit: Payer: Self-pay | Admitting: Interventional Cardiology

## 2021-09-06 DIAGNOSIS — Z6833 Body mass index (BMI) 33.0-33.9, adult: Secondary | ICD-10-CM | POA: Diagnosis not present

## 2021-09-06 DIAGNOSIS — Z124 Encounter for screening for malignant neoplasm of cervix: Secondary | ICD-10-CM | POA: Diagnosis not present

## 2021-09-14 DIAGNOSIS — Z0000 Encounter for general adult medical examination without abnormal findings: Secondary | ICD-10-CM | POA: Diagnosis not present

## 2021-09-14 DIAGNOSIS — Z23 Encounter for immunization: Secondary | ICD-10-CM | POA: Diagnosis not present

## 2021-09-14 DIAGNOSIS — I10 Essential (primary) hypertension: Secondary | ICD-10-CM | POA: Diagnosis not present

## 2021-09-14 DIAGNOSIS — E785 Hyperlipidemia, unspecified: Secondary | ICD-10-CM | POA: Diagnosis not present

## 2021-09-27 DIAGNOSIS — J069 Acute upper respiratory infection, unspecified: Secondary | ICD-10-CM | POA: Diagnosis not present

## 2021-09-27 DIAGNOSIS — U071 COVID-19: Secondary | ICD-10-CM | POA: Diagnosis not present

## 2021-10-01 ENCOUNTER — Other Ambulatory Visit: Payer: Self-pay | Admitting: Interventional Cardiology

## 2021-10-15 ENCOUNTER — Other Ambulatory Visit: Payer: Self-pay | Admitting: Interventional Cardiology

## 2021-10-17 NOTE — Progress Notes (Signed)
Cardiology Office Note:    Date:  10/18/2021   ID:  Shyree, Pieczynski [birth year], MRN [id number]  PCP:  Orpah Melter, MD  Cardiologist:  Sinclair Grooms, MD   Referring MD: Orpah Melter, MD   Chief Complaint  Patient presents with   Coronary Artery Disease   Hyperlipidemia   Hypertension    History of Present Illness:    [name] [name] is a 76 y.o. female with a hx of atrial premature contractions, primary hypertension, hyperlipidemia and paroxysmal atrial fibrillation (remote).   She denies cardiac complaints.  She is staying active.  We decided against therapy for lipids given a coronary calcium score of 0.  She does have aortic atherosclerosis on non coronary CT.  She denies claudication, does have occasional palpitations, and has not noticed neurological complaints.  Past Medical History:  Diagnosis Date   Essential (primary) hypertension    Palpitation    Paroxysmal atrial fibrillation (HCC)     History reviewed. No pertinent surgical history.  Current Medications: Current Meds  Medication Sig   aspirin 81 MG tablet Take 81 mg by mouth every other day.   Multiple Vitamins-Minerals (EYE VITAMINS PO) Take 1 tablet by mouth daily.   vitamin C (ASCORBIC ACID) 500 MG tablet Take 500 mg by mouth daily.   [DISCONTINUED] losartan-hydrochlorothiazide (HYZAAR) 50-12.5 MG tablet TAKE 1 TABLET BY MOUTH EVERY DAY   [DISCONTINUED] metoprolol succinate (TOPROL-XL) 100 MG 24 hr tablet Take 1 tablet (100 mg total) by mouth daily. Please keep upcoming appt for future refills     Allergies:   Lisinopril   Social History   Socioeconomic History   Marital status: Married    Spouse name: Not on file   Number of children: Not on file   Years of education: Not on file   Highest education level: Not on file  Occupational History   Not on file  Tobacco Use   Smoking status: Never   Smokeless tobacco: Never  Substance and Sexual Activity   Alcohol use: No     Alcohol/week: 0.0 standard drinks   Drug use: No   Sexual activity: Not on file  Other Topics Concern   Not on file  Social History Narrative   Not on file   Social Determinants of Health   Financial Resource Strain: Not on file  Food Insecurity: Not on file  Transportation Needs: Not on file  Physical Activity: Not on file  Stress: Not on file  Social Connections: Not on file     Family History: The patient's family history includes Colon cancer in her father; Diabetes in her brother, brother, and sister; Healthy in her sister and sister; Heart attack in her mother; Heart disease in her brother and mother; Leukemia in her brother.  ROS:   Please see the history of present illness.    Wants to treat with diet and lifestyle changes and is against being overly titrated on medications.  All other systems reviewed and are negative.  EKGs/Labs/Other Studies Reviewed:    The following studies were reviewed today:  COR CA SCORE 08/2020: IMPRESSION: Coronary calcium score of 0. This was 0 percentile for age and sex matched control.   Mild aortic atherosclerosis.   Mildly dilated pulmonary artery suggestive of pulmonary hypertension.  EKG:  EKG normal sinus rhythm with premature atrial contractions complete right bundle branch block..  EKG otherwise normal with exception of left atrial abnormality.  When compared to the prior tracing from January 2022,  the PACs are new.  Recent Labs: No results found for requested labs within last 8760 hours.  Recent Lipid Panel No results found for: CHOL, TRIG, HDL, CHOLHDL, VLDL, LDLCALC, LDLDIRECT  Physical Exam:    VS:  BP 138/88    Pulse 83    Ht 5\' 5"  (1.651 m)    Wt 197 lb 3.2 oz (89.4 kg)    SpO2 96%    BMI 32.82 kg/m     Wt Readings from Last 3 Encounters:  10/18/21 197 lb 3.2 oz (89.4 kg)  08/31/20 196 lb (88.9 kg)  07/25/19 200 lb 12.8 oz (91.1 kg)     GEN: Overweight but improved compared to 2 years ago. No acute  distress HEENT: Normal NECK: No JVD. LYMPHATICS: No lymphadenopathy CARDIAC: Soft 1/6 systolic right upper sternal murmur. RRR no gallop, or edema. VASCULAR:  Normal Pulses. No bruits. RESPIRATORY:  Clear to auscultation without rales, wheezing or rhonchi  ABDOMEN: Soft, non-tender, non-distended, No pulsatile mass, MUSCULOSKELETAL: No deformity  SKIN: Warm and dry NEUROLOGIC:  Alert and oriented x 3 PSYCHIATRIC:  Normal affect   ASSESSMENT:    1. Essential hypertension   2. Hyperlipidemia LDL goal <100   3. Paroxysmal atrial fibrillation (HCC)    PLAN:    In order of problems listed above:  Blood pressure control is adequate.  She is less than 140/90.  Our target is 130/80.  Again reiterated the importance of exercise, low-salt diet, and weight loss. Aortic atherosclerosis but coronary calcium score of 0.  We had conversation but decided to continue with lifestyle changes including diet and exercise rather than adding statin therapy. No clinical episodes of atrial fibrillation.  Today's EKG demonstrates PACs.  She has no palpitations or other symptoms to suggest AF.   Overall education and awareness concerning primary risk prevention was discussed in detail: LDL less than 70, hemoglobin A1c less than 7, blood pressure target less than 130/80 mmHg, >150 minutes of moderate aerobic activity per week, avoidance of smoking, weight control (via diet and exercise), and continued surveillance/management of/for obstructive sleep apnea.    Medication Adjustments/Labs and Tests Ordered: Current medicines are reviewed at length with the patient today.  Concerns regarding medicines are outlined above.  Orders Placed This Encounter  Procedures   EKG 12-Lead   Meds ordered this encounter  Medications   losartan-hydrochlorothiazide (HYZAAR) 50-12.5 MG tablet    Sig: Take 1 tablet by mouth daily.    Dispense:  90 tablet    Refill:  3   metoprolol succinate (TOPROL-XL) 100 MG 24 hr tablet     Sig: Take 1 tablet (100 mg total) by mouth daily. Please keep upcoming appt for future refills    Dispense:  90 tablet    Refill:  3    Patient Instructions  Medication Instructions:  Your physician recommends that you continue on your current medications as directed. Please refer to the Current Medication list given to you today.  *If you need a refill on your cardiac medications before your next appointment, please call your pharmacy*   Lab Work: None If you have labs (blood work) drawn today and your tests are completely normal, you will receive your results only by: [place] (if you have MyChart) OR A paper copy in the mail If you have any lab test that is abnormal or we need to change your treatment, we will call you to review the results.   Testing/Procedures: None   Follow-Up: At [facility], you  and your health needs are our priority.  As part of our continuing mission to provide you with exceptional heart care, we have created designated Provider Care Teams.  These Care Teams include your primary Cardiologist (physician) and Advanced Practice Providers (APPs -  Physician Assistants and Nurse Practitioners) who all work together to provide you with the care you need, when you need it.  We recommend signing up for the patient portal called "MyChart".  Sign up information is provided on this After Visit Summary.  MyChart is used to connect with patients for Virtual Visits (Telemedicine).  Patients are able to view lab/test results, encounter notes, upcoming appointments, etc.  Non-urgent messages can be sent to your provider as well.   To learn more about what you can do with MyChart, go to [email or web address].    Your next appointment:   1 year(s)  The format for your next appointment:   In Person  Provider:   Sinclair Grooms, MD     Other Instructions     Signed, Sinclair Grooms, MD  10/18/2021 10:35 AM    [place]

## 2021-10-18 ENCOUNTER — Encounter: Payer: Self-pay | Admitting: Interventional Cardiology

## 2021-10-18 ENCOUNTER — Ambulatory Visit: Payer: Medicare HMO | Admitting: Interventional Cardiology

## 2021-10-18 ENCOUNTER — Other Ambulatory Visit: Payer: Self-pay

## 2021-10-18 VITALS — BP 138/88 | HR 83 | Ht 65.0 in | Wt 197.2 lb

## 2021-10-18 DIAGNOSIS — E785 Hyperlipidemia, unspecified: Secondary | ICD-10-CM | POA: Diagnosis not present

## 2021-10-18 DIAGNOSIS — I10 Essential (primary) hypertension: Secondary | ICD-10-CM

## 2021-10-18 DIAGNOSIS — I480 Paroxysmal atrial fibrillation: Secondary | ICD-10-CM

## 2021-10-18 MED ORDER — LOSARTAN POTASSIUM-HCTZ 50-12.5 MG PO TABS: 1.0000 | ORAL_TABLET | Freq: Every day | ORAL | 3 refills | Status: DC

## 2021-10-18 MED ORDER — METOPROLOL SUCCINATE ER 100 MG PO TB24: 100.0000 mg | ORAL_TABLET | Freq: Every day | ORAL | 3 refills | Status: DC

## 2021-10-18 NOTE — Patient Instructions (Signed)

## 2022-05-02 DIAGNOSIS — N3000 Acute cystitis without hematuria: Secondary | ICD-10-CM | POA: Diagnosis not present

## 2022-05-02 DIAGNOSIS — R350 Frequency of micturition: Secondary | ICD-10-CM | POA: Diagnosis not present

## 2022-08-08 DIAGNOSIS — R300 Dysuria: Secondary | ICD-10-CM | POA: Diagnosis not present

## 2022-09-14 DIAGNOSIS — Z6833 Body mass index (BMI) 33.0-33.9, adult: Secondary | ICD-10-CM | POA: Diagnosis not present

## 2022-09-14 DIAGNOSIS — Z01419 Encounter for gynecological examination (general) (routine) without abnormal findings: Secondary | ICD-10-CM | POA: Diagnosis not present

## 2022-09-20 DIAGNOSIS — Z1211 Encounter for screening for malignant neoplasm of colon: Secondary | ICD-10-CM | POA: Diagnosis not present

## 2022-09-20 DIAGNOSIS — E785 Hyperlipidemia, unspecified: Secondary | ICD-10-CM | POA: Diagnosis not present

## 2022-09-20 DIAGNOSIS — I480 Paroxysmal atrial fibrillation: Secondary | ICD-10-CM | POA: Diagnosis not present

## 2022-09-20 DIAGNOSIS — Z0000 Encounter for general adult medical examination without abnormal findings: Secondary | ICD-10-CM | POA: Diagnosis not present

## 2022-09-20 DIAGNOSIS — I700 Atherosclerosis of aorta: Secondary | ICD-10-CM | POA: Diagnosis not present

## 2022-09-20 DIAGNOSIS — I10 Essential (primary) hypertension: Secondary | ICD-10-CM | POA: Diagnosis not present

## 2022-09-24 DIAGNOSIS — J069 Acute upper respiratory infection, unspecified: Secondary | ICD-10-CM | POA: Diagnosis not present

## 2022-09-24 DIAGNOSIS — R109 Unspecified abdominal pain: Secondary | ICD-10-CM | POA: Diagnosis not present

## 2022-09-27 DIAGNOSIS — K469 Unspecified abdominal hernia without obstruction or gangrene: Secondary | ICD-10-CM | POA: Diagnosis not present

## 2022-10-20 ENCOUNTER — Ambulatory Visit: Payer: Medicare HMO | Attending: Cardiology | Admitting: Cardiology

## 2022-10-20 ENCOUNTER — Encounter: Payer: Self-pay | Admitting: Cardiology

## 2022-10-20 VITALS — BP 134/82 | HR 86 | Ht 65.0 in | Wt 199.2 lb

## 2022-10-20 DIAGNOSIS — I480 Paroxysmal atrial fibrillation: Secondary | ICD-10-CM

## 2022-10-20 DIAGNOSIS — I10 Essential (primary) hypertension: Secondary | ICD-10-CM

## 2022-10-20 DIAGNOSIS — E785 Hyperlipidemia, unspecified: Secondary | ICD-10-CM

## 2022-10-20 DIAGNOSIS — R002 Palpitations: Secondary | ICD-10-CM

## 2022-10-20 MED ORDER — METOPROLOL SUCCINATE ER 100 MG PO TB24: 100.0000 mg | ORAL_TABLET | Freq: Every day | ORAL | 3 refills | Status: DC

## 2022-10-20 MED ORDER — LOSARTAN POTASSIUM-HCTZ 50-12.5 MG PO TABS: 1.0000 | ORAL_TABLET | Freq: Every day | ORAL | 3 refills | Status: DC

## 2022-10-20 NOTE — Patient Instructions (Signed)
Medication Instructions:  Your physician recommends that you continue on your current medications as directed. Please refer to the Current Medication list given to you today. *If you need a refill on your cardiac medications before your next appointment, please call your pharmacy*  Lab Work: If you have labs (blood work) drawn today and your tests are completely normal, you will receive your results only by: [place] (if you have MyChart) OR A paper copy in the mail If you have any lab test that is abnormal or we need to change your treatment, we will call you to review the results.  Testing/Procedures: None ordered today.  Follow-Up: At [facility], you and your health needs are our priority.  As part of our continuing mission to provide you with exceptional heart care, we have created designated Provider Care Teams.  These Care Teams include your primary Cardiologist (physician) and Advanced Practice Providers (APPs -  Physician Assistants and Nurse Practitioners) who all work together to provide you with the care you need, when you need it.  We recommend signing up for the patient portal called "MyChart".  Sign up information is provided on this After Visit Summary.  MyChart is used to connect with patients for Virtual Visits (Telemedicine).  Patients are able to view lab/test results, encounter notes, upcoming appointments, etc.  Non-urgent messages can be sent to your provider as well.   To learn more about what you can do with MyChart, go to [email or web address].    Your next appointment:   1 year(s)  Provider:   Candee Furbish, MD

## 2022-10-20 NOTE — Progress Notes (Signed)
Cardiology Office Note:    Date:  10/20/2022   ID:  [name] [name], [name] [name] 17-May-1946, MRN [id number]  PCP:  Orpah Melter, MD   [place] Prov[facility] Cardiologist:  Candee Furbish, MD     Referring MD: Orpah Melter, MD    History of Present Illness:    [name] [name] is a 77 y.o. female former patient of Dr. Mallie Mussel Smith's with a hx of atrial premature contractions, primary hypertension, hyperlipidemia and paroxysmal atrial fibrillation (remote).    She denies cardiac complaints.  She is staying active.  Previously decided against therapy for lipids given a coronary calcium score of 0. She does have aortic atherosclerosis on non coronary CT.   She denies claudication, does have occasional palpitations, and has not noticed neurological complaints.  Overall doing quite well no fevers chills nausea vomiting syncope bleeding.  Younger brother had gastric cancer 2019, he had CHF ICD after MI, mother CVA.   [employer], she worked there 35 years.  Stays active.  Past Medical History:  Diagnosis Date   Essential (primary) hypertension    Palpitation    Paroxysmal atrial fibrillation (HCC)     No past surgical history on file.  Current Medications: Current Meds  Medication Sig   aspirin 81 MG tablet Take 81 mg by mouth every other day.   Multiple Vitamins-Minerals (EYE VITAMINS PO) Take 1 tablet by mouth daily.   vitamin C (ASCORBIC ACID) 500 MG tablet Take 500 mg by mouth daily.   [DISCONTINUED] losartan-hydrochlorothiazide (HYZAAR) 50-12.5 MG tablet Take 1 tablet by mouth daily.   [DISCONTINUED] metoprolol succinate (TOPROL-XL) 100 MG 24 hr tablet Take 1 tablet (100 mg total) by mouth daily. Please keep upcoming appt for future refills     Allergies:   Lisinopril   Social History   Socioeconomic History   Marital status: Married    Spouse name: Not on file   Number of children: Not on file   Years of education: Not on file   Highest education  level: Not on file  Occupational History   Not on file  Tobacco Use   Smoking status: Never   Smokeless tobacco: Never  Substance and Sexual Activity   Alcohol use: No    Alcohol/week: 0.0 standard drinks of alcohol   Drug use: No   Sexual activity: Not on file  Other Topics Concern   Not on file  Social History Narrative   Not on file   Social Determinants of Health   Financial Resource Strain: Not on file  Food Insecurity: Not on file  Transportation Needs: Not on file  Physical Activity: Not on file  Stress: Not on file  Social Connections: Not on file     Family History: The patient's family history includes Colon cancer in her father; Diabetes in her brother, brother, and sister; Healthy in her sister and sister; Heart attack in her mother; Heart disease in her brother and mother; Leukemia in her brother.  ROS:   Please see the history of present illness.     All other systems reviewed and are negative.  EKGs/Labs/Other Studies Reviewed:    The following studies were reviewed today:  COR CA SCORE 08/2020: IMPRESSION: Coronary calcium score of 0. This was 0 percentile for age and sex matched control.   Mild aortic atherosclerosis.   Mildly dilated pulmonary artery suggestive of pulmonary hypertension.  EKG:  10/20/22: NSR NSSTW changes, PAC's  Recent Labs: No results found for requested labs within  last 365 days.  Recent Lipid Panel No results found for: "CHOL", "TRIG", "HDL", "CHOLHDL", "VLDL", "LDLCALC", "LDLDIRECT"   Risk Assessment/Calculations:               Physical Exam:    VS:  BP 134/82   Pulse 86   Ht '5\' 5"'$  (1.651 m)   Wt 199 lb 3.2 oz (90.4 kg)   SpO2 98%   BMI 33.15 kg/m     Wt Readings from Last 3 Encounters:  10/20/22 199 lb 3.2 oz (90.4 kg)  10/18/21 197 lb 3.2 oz (89.4 kg)  08/31/20 196 lb (88.9 kg)     GEN:  Well nourished, well developed in no acute distress HEENT: Normal NECK: No JVD; No carotid bruits LYMPHATICS:  No lymphadenopathy CARDIAC: ectopy RRR, no murmurs, rubs, gallops RESPIRATORY:  Clear to auscultation without rales, wheezing or rhonchi  ABDOMEN: Soft, non-tender, non-distended MUSCULOSKELETAL:  No edema; No deformity  SKIN: Warm and dry NEUROLOGIC:  Alert and oriented x 3 PSYCHIATRIC:  Normal affect   ASSESSMENT:    1. Paroxysmal atrial fibrillation (HCC)   2. Hyperlipidemia LDL goal <100   3. Essential hypertension   4. Palpitation    PLAN:    In order of problems listed above:  HTN Blood pressure control is adequate.  She is less than 140/90.  Our target is 130/80.  Again reiterated the importance of exercise, low-salt diet, and weight loss.  Hyperlipidemia Aortic atherosclerosis but coronary calcium score of 0.  Dr, Tamala Julian had conversation but decided to continue with lifestyle changes including diet and exercise rather than adding statin therapy.  PAF remote history No clinical episodes of atrial fibrillation.  Prior EKG demonstrates PACs.  She has no palpitations or other symptoms to suggest AF. No documented evidence for many years.     Overall education and awareness concerning primary risk prevention was discussed in detail: LDL less than 70, hemoglobin A1c less than 7, blood pressure target less than 130/80 mmHg, >150 minutes of moderate aerobic activity per week, avoidance of smoking, weight control (via diet and exercise), and continued surveillance/management of/for obstructive sleep apnea.            Medication Adjustments/Labs and Tests Ordered: Current medicines are reviewed at length with the patient today.  Concerns regarding medicines are outlined above.  Orders Placed This Encounter  Procedures   EKG 12-Lead   Meds ordered this encounter  Medications   losartan-hydrochlorothiazide (HYZAAR) 50-12.5 MG tablet    Sig: Take 1 tablet by mouth daily.    Dispense:  90 tablet    Refill:  3   metoprolol succinate (TOPROL-XL) 100 MG 24 hr tablet    Sig:  Take 1 tablet (100 mg total) by mouth daily. Please keep upcoming appt for future refills    Dispense:  90 tablet    Refill:  3    Patient Instructions  Medication Instructions:  Your physician recommends that you continue on your current medications as directed. Please refer to the Current Medication list given to you today. *If you need a refill on your cardiac medications before your next appointment, please call your pharmacy*  Lab Work: If you have labs (blood work) drawn today and your tests are completely normal, you will receive your results only by: [place] (if you have MyChart) OR A paper copy in the mail If you have any lab test that is abnormal or we need to change your treatment, we will call you to review the  results.  Testing/Procedures: None ordered today.  Follow-Up: At [facility], you and your health needs are our priority.  As part of our continuing mission to provide you with exceptional heart care, we have created designated Provider Care Teams.  These Care Teams include your primary Cardiologist (physician) and Advanced Practice Providers (APPs -  Physician Assistants and Nurse Practitioners) who all work together to provide you with the care you need, when you need it.  We recommend signing up for the patient portal called "MyChart".  Sign up information is provided on this After Visit Summary.  MyChart is used to connect with patients for Virtual Visits (Telemedicine).  Patients are able to view lab/test results, encounter notes, upcoming appointments, etc.  Non-urgent messages can be sent to your provider as well.   To learn more about what you can do with MyChart, go to [email or web address].    Your next appointment:   1 year(s)  Provider:   Candee Furbish, MD         Signed, Candee Furbish, MD  10/20/2022 9:57 AM    [place]

## 2023-11-03 ENCOUNTER — Other Ambulatory Visit: Payer: Self-pay | Admitting: Cardiology

## 2023-11-07 DIAGNOSIS — H353133 Nonexudative age-related macular degeneration, bilateral, advanced atrophic without subfoveal involvement: Secondary | ICD-10-CM | POA: Diagnosis not present

## 2023-11-16 ENCOUNTER — Encounter: Payer: Self-pay | Admitting: Cardiology

## 2023-11-16 ENCOUNTER — Ambulatory Visit: Payer: Medicare HMO | Attending: Cardiology | Admitting: Cardiology

## 2023-11-16 VITALS — BP 124/76 | HR 111 | Ht 66.0 in | Wt 194.6 lb

## 2023-11-16 DIAGNOSIS — I10 Essential (primary) hypertension: Secondary | ICD-10-CM | POA: Diagnosis not present

## 2023-11-16 DIAGNOSIS — I480 Paroxysmal atrial fibrillation: Secondary | ICD-10-CM

## 2023-11-16 MED ORDER — METOPROLOL SUCCINATE ER 100 MG PO TB24: 100.0000 mg | ORAL_TABLET | Freq: Every day | ORAL | 3 refills | Status: AC

## 2023-11-16 MED ORDER — LOSARTAN POTASSIUM-HCTZ 50-12.5 MG PO TABS: 1.0000 | ORAL_TABLET | Freq: Every day | ORAL | 3 refills | Status: AC

## 2023-11-16 NOTE — Progress Notes (Signed)
 Cardiology Office Note:  .   Date:  11/16/2023  ID:  [name] [name], DOB 1946-02-23, MRN [id number] PCP: Joycelyn Rua, MD   [facility] Cardiologist:  Donato Schultz, MD     History of Present Illness: .   [name] [name] is a 78 y.o. female Discussed the use of AI scribe software for clinical note transcription with the patient, who gave verbal consent to proceed.  History of Present Illness [name] [name] is a 78 year old female with premature atrial contractions who presents for follow-up.  She has a history of premature atrial contractions (PACs), confirmed by both prior and current EKGs. She is asymptomatic with no chest pain or shortness of breath. Her cardiovascular health is stable, and she is not experiencing any significant issues related to PACs at this time.  Hypertension is well-controlled with her current medication regimen, which includes losartan hydrochlorothiazide 50/12.5 mg daily and Toprol XL 100 mg daily. Blood pressure readings are within normal limits. She takes aspirin 81 mg daily as part of her management plan.  She has mild aortic atherosclerosis but reports no symptoms, including no chest pain or shortness of breath. The condition is being managed with aspirin therapy, and there are no significant health issues at this time.  She has a history of hyperlipidemia but decided against lipid therapy after a coronary calcium score of zero in 2022. Lipid levels are monitored as needed.  She has a remote history of paroxysmal atrial fibrillation with no current symptoms or episodes reported. This may have been ECG with PAC's. Monitoring for any recurrence of symptoms is ongoing.  She maintains a healthy lifestyle, staying active and engaged in her health care. She worked at [employer] for 35 years and continues to lead an active life.      ROS: No CP, no SOB  Studies Reviewed: Marland Kitchen   EKG Interpretation Date/Time:  Thursday November 16 2023 08:04:09 EDT Ventricular Rate:  111 PR Interval:  138 QRS Duration:  84 QT Interval:  340 QTC Calculation: 462 R Axis:   46  Text Interpretation: Sinus tachycardia Possible Left atrial enlargement Nonspecific ST and T wave abnormality No previous ECGs available Confirmed by Donato Schultz ([id number]) on 11/16/2023 8:30:54 AM    Results RADIOLOGY Coronary calcium score: 0 (2022) Aortic atherosclerosis: Subtle plaque  DIAGNOSTIC EKG: Premature atrial contractions (11/16/2023) Risk Assessment/Calculations:            Physical Exam:   VS:  BP 124/76   Pulse (!) 111   Ht 5\' 6"  (1.676 m)   Wt 194 lb 9.6 oz (88.3 kg)   SpO2 94%   BMI 31.41 kg/m    Wt Readings from Last 3 Encounters:  11/16/23 194 lb 9.6 oz (88.3 kg)  10/20/22 199 lb 3.2 oz (90.4 kg)  10/18/21 197 lb 3.2 oz (89.4 kg)    GEN: Well nourished, well developed in no acute distress NECK: No JVD; No carotid bruits CARDIAC: RRR, no murmurs, no rubs, no gallops RESPIRATORY:  Clear to auscultation without rales, wheezing or rhonchi  ABDOMEN: Soft, non-tender, non-distended EXTREMITIES:  No edema; No deformity   ASSESSMENT AND PLAN: .    Assessment and Plan Assessment & Plan Premature atrial contractions (PACs) EKG shows PACs consistent with previous findings. The condition is asymptomatic with no chest pain or dyspnea. Current management is satisfactory. - Refill metoprolol for one year and transition prescription management to primary care provider, Dr. Lenise Arena.  Aortic atherosclerosis Subtle aortic atherosclerosis  is present, asymptomatic, and not of immediate concern.  Hypertension Blood pressure is well-controlled on losartan hydrochlorothiazide and Toprol XL. - Refill losartan hydrochlorothiazide and Toprol XL for one year and transition prescription management to primary care provider, Dr. Lenise Arena.  Follow-up Cardiac conditions are well-managed, allowing for transition/ graduation from the cardiology clinic.  Dr. Lenise Arena will assume management of medications. - Send a note to Dr. Lenise Arena regarding the transition of care. - Advise her to contact the cardiology clinic if any cardiac issues arise in the future.          Signed, Donato Schultz, MD

## 2023-11-16 NOTE — Patient Instructions (Signed)
 Medication Instructions:  Refilled Metoprolol and Losartan (PCP to control medication)  *If you need a refill on your cardiac medications before your next appointment, please call your pharmacy*   Follow-Up: At [facility], you and your health needs are our priority.  As part of our continuing mission to provide you with exceptional heart care, we have created designated Provider Care Teams.  These Care Teams include your primary Cardiologist (physician) and Advanced Practice Providers (APPs -  Physician Assistants and Nurse Practitioners) who all work together to provide you with the care you need, when you need it.  We recommend signing up for the patient portal called "MyChart".  Sign up information is provided on this After Visit Summary.  MyChart is used to connect with patients for Virtual Visits (Telemedicine).  Patients are able to view lab/test results, encounter notes, upcoming appointments, etc.  Non-urgent messages can be sent to your provider as well.   To learn more about what you can do with MyChart, go to [email or web address].    Your next appointment:   As needed   Provider:   Donato Schultz, MD     Other Instructions     1st Floor: - Lobby - Registration  - Pharmacy  - Lab - Cafe  2nd Floor: - PV Lab - Diagnostic Testing (echo, CT, nuclear med)  3rd Floor: - Vacant  4th Floor: - TCTS (cardiothoracic surgery) - AFib Clinic - Structural Heart Clinic - Vascular Surgery  - Vascular Ultrasound  5th Floor: - [facility] Cardiology (general and EP) - Clinical Pharmacy for coumadin, hypertension, lipid, weight-loss medications, and med management appointments    Valet parking services will be available as well.

## 2023-11-20 DIAGNOSIS — R8290 Unspecified abnormal findings in urine: Secondary | ICD-10-CM | POA: Diagnosis not present

## 2023-11-20 DIAGNOSIS — J069 Acute upper respiratory infection, unspecified: Secondary | ICD-10-CM | POA: Diagnosis not present
